# Patient Record
Sex: Female | Born: 1970 | Race: White | Hispanic: No | Marital: Married | Smoking: Never smoker
Health system: Southern US, Community
[De-identification: ages and names within clinical notes are randomized; demographics above are authoritative.]

## PROBLEM LIST (undated history)

## (undated) DIAGNOSIS — J189 Pneumonia, unspecified organism: Secondary | ICD-10-CM

## (undated) DIAGNOSIS — R519 Headache, unspecified: Secondary | ICD-10-CM

## (undated) DIAGNOSIS — Z803 Family history of malignant neoplasm of breast: Secondary | ICD-10-CM

## (undated) DIAGNOSIS — R51 Headache: Secondary | ICD-10-CM

## (undated) DIAGNOSIS — M1990 Unspecified osteoarthritis, unspecified site: Secondary | ICD-10-CM

## (undated) DIAGNOSIS — Z800 Family history of malignant neoplasm of digestive organs: Secondary | ICD-10-CM

## (undated) DIAGNOSIS — Z8489 Family history of other specified conditions: Secondary | ICD-10-CM

## (undated) DIAGNOSIS — K219 Gastro-esophageal reflux disease without esophagitis: Secondary | ICD-10-CM

## (undated) HISTORY — DX: Family history of malignant neoplasm of breast: Z80.3

## (undated) HISTORY — DX: Family history of malignant neoplasm of digestive organs: Z80.0

## (undated) HISTORY — PX: BREAST BIOPSY: SHX20

---

## 2006-12-01 ENCOUNTER — Encounter: Admission: RE | Admit: 2006-12-01 | Discharge: 2006-12-01 | Payer: Self-pay | Admitting: Surgery

## 2006-12-16 ENCOUNTER — Ambulatory Visit: Payer: Self-pay | Admitting: Oncology

## 2007-02-21 ENCOUNTER — Ambulatory Visit: Payer: Self-pay | Admitting: Oncology

## 2017-11-07 ENCOUNTER — Other Ambulatory Visit (HOSPITAL_COMMUNITY): Payer: Self-pay | Admitting: Gynecology

## 2017-11-07 DIAGNOSIS — Z803 Family history of malignant neoplasm of breast: Secondary | ICD-10-CM

## 2017-11-11 ENCOUNTER — Telehealth: Payer: Self-pay | Admitting: Genetic Counselor

## 2017-11-11 ENCOUNTER — Encounter: Payer: Self-pay | Admitting: Genetic Counselor

## 2017-11-11 NOTE — Patient Instructions (Addendum)
Aiysha Jillson  11/11/2017   Your procedure is scheduled on: 11-21-17     Report to [facility] Main  Entrance    Report to Admitting at 8:15 AM    Call this number if you have problems the morning of surgery [phone]     Remember: Do not eat food or drink liquids :After Midnight.    BRUSH YOUR TEETH MORNING OF SURGERY AND RINSE YOUR MOUTH OUT, NO CHEWING GUM CANDY OR MINTS.     Take these medicines the morning of surgery with A SIP OF WATER: Loratadine (Claritin) and Sudafed. You can also bring and use your eyedrops as needed.                                 You may not have any metal on your body including hair pins and              piercings  Do not wear jewelry, make-up, lotions, powders or perfumes, deodorant             Do not wear nail polish.  Do not shave  48 hours prior to surgery.                Do not bring valuables to the hospital. [place].  Contacts, dentures or bridgework may not be worn into surgery.  Leave suitcase in the car. After surgery it may be brought to your room.      Special Instructions: N/A              Please read over the following fact sheets you were given: _____________________________________________________________________             [facility] - Preparing for Surgery Before surgery, you can play an important role.  Because skin is not sterile, your skin needs to be as free of germs as possible.  You can reduce the number of germs on your skin by washing with CHG (chlorahexidine gluconate) soap before surgery.  CHG is an antiseptic cleaner which kills germs and bonds with the skin to continue killing germs even after washing. Please DO NOT use if you have an allergy to CHG or antibacterial soaps.  If your skin becomes reddened/irritated stop using the CHG and inform your nurse when you arrive at Short Stay. Do not shave (including legs and underarms) for  at least 48 hours prior to the first CHG shower.  You may shave your face/neck. Please follow these instructions carefully:  1.  Shower with CHG Soap the night before surgery and the  morning of Surgery.  2.  If you choose to wash your hair, wash your hair first as usual with your  normal  shampoo.  3.  After you shampoo, rinse your hair and body thoroughly to remove the  shampoo.                           4.  Use CHG as you would any other liquid soap.  You can apply chg directly  to the skin and wash                       Gently  with a scrungie or clean washcloth.  5.  Apply the CHG Soap to your body ONLY FROM THE NECK DOWN.   Do not use on face/ open                           Wound or open sores. Avoid contact with eyes, ears mouth and genitals (private parts).                       Wash face,  Genitals (private parts) with your normal soap.             6.  Wash thoroughly, paying special attention to the area where your surgery  will be performed.  7.  Thoroughly rinse your body with warm water from the neck down.  8.  DO NOT shower/wash with your normal soap after using and rinsing off  the CHG Soap.                9.  Pat yourself dry with a clean towel.            10.  Wear clean pajamas.            11.  Place clean sheets on your bed the night of your first shower and do not  sleep with pets. Day of Surgery : Do not apply any lotions/deodorants the morning of surgery.  Please wear clean clothes to the hospital/surgery center.  FAILURE TO FOLLOW THESE INSTRUCTIONS MAY RESULT IN THE CANCELLATION OF YOUR SURGERY PATIENT SIGNATURE_________________________________  NURSE SIGNATURE__________________________________  ________________________________________________________________________

## 2017-11-11 NOTE — Telephone Encounter (Signed)
Received a genetic counseling appt from the [facility] for fhx of breast cancer. Pt has been scheduled to see Roma Kayser on 12/23 at 9am. Pt aware to arrive 15 minutes early. Letter mailed.

## 2017-11-14 ENCOUNTER — Ambulatory Visit (HOSPITAL_COMMUNITY)
Admission: RE | Admit: 2017-11-14 | Discharge: 2017-11-14 | Disposition: A | Discharge status: Home / Self Care | Payer: Non-veteran care | Source: Ambulatory Visit | Attending: Gynecology | Admitting: Gynecology

## 2017-11-14 ENCOUNTER — Encounter (HOSPITAL_COMMUNITY): Payer: Self-pay | Admitting: *Deleted

## 2017-11-14 ENCOUNTER — Encounter (HOSPITAL_COMMUNITY)
Admission: RE | Admit: 2017-11-14 | Discharge: 2017-11-14 | Disposition: A | Discharge status: Home / Self Care | Payer: Non-veteran care | Source: Ambulatory Visit | Attending: Orthopedic Surgery | Admitting: Orthopedic Surgery

## 2017-11-14 ENCOUNTER — Other Ambulatory Visit: Payer: Self-pay

## 2017-11-14 DIAGNOSIS — Z01818 Encounter for other preprocedural examination: Secondary | ICD-10-CM | POA: Insufficient documentation

## 2017-11-14 DIAGNOSIS — Z803 Family history of malignant neoplasm of breast: Secondary | ICD-10-CM | POA: Diagnosis not present

## 2017-11-14 DIAGNOSIS — Z807 Family history of other malignant neoplasms of lymphoid, hematopoietic and related tissues: Secondary | ICD-10-CM | POA: Insufficient documentation

## 2017-11-14 DIAGNOSIS — N6002 Solitary cyst of left breast: Secondary | ICD-10-CM | POA: Insufficient documentation

## 2017-11-14 DIAGNOSIS — N6001 Solitary cyst of right breast: Secondary | ICD-10-CM | POA: Diagnosis not present

## 2017-11-14 HISTORY — DX: Unspecified osteoarthritis, unspecified site: M19.90

## 2017-11-14 HISTORY — DX: Headache, unspecified: R51.9

## 2017-11-14 HISTORY — DX: Gastro-esophageal reflux disease without esophagitis: K21.9

## 2017-11-14 HISTORY — DX: Pneumonia, unspecified organism: J18.9

## 2017-11-14 HISTORY — DX: Headache: R51

## 2017-11-14 LAB — BASIC METABOLIC PANEL
Anion gap: 9 (ref 5–15)
BUN: 14 mg/dL (ref 6–20)
CALCIUM: 9.7 mg/dL (ref 8.9–10.3)
CO2: 27 mmol/L (ref 22–32)
CREATININE: 0.79 mg/dL (ref 0.44–1.00)
Chloride: 102 mmol/L (ref 98–111)
GFR calc Af Amer: >60 mL/min (ref >60)
GFR calc non Af Amer: >60 mL/min (ref >60)
GLUCOSE: 91 mg/dL (ref 70–99)
Potassium: 4.4 mmol/L (ref 3.5–5.1)
Sodium: 138 mmol/L (ref 135–145)

## 2017-11-14 LAB — SURGICAL PCR SCREEN
MRSA, PCR: NEGATIVE
Staphylococcus aureus: POSITIVE — AB

## 2017-11-14 LAB — CBC
HCT: 39.9 % (ref 36.0–46.0)
Hemoglobin: 12.5 g/dL (ref 12.0–15.0)
MCH: 28.4 pg (ref 26.0–34.0)
MCHC: 31.3 g/dL (ref 30.0–36.0)
MCV: 90.7 fL (ref 80.0–100.0)
PLATELETS: 296 10*3/uL (ref 150–400)
RBC: 4.4 MIL/uL (ref 3.87–5.11)
RDW: 14 % (ref 11.5–15.5)
WBC: 5.4 10*3/uL (ref 4.0–10.5)
nRBC: 0 % (ref 0.0–0.2)

## 2017-11-14 LAB — PREGNANCY, URINE: Preg Test, Ur: NEGATIVE

## 2017-11-14 MED ORDER — GADOBUTROL 1 MMOL/ML IV SOLN
8.0000 mL | Freq: Once | INTRAVENOUS | Status: AC | PRN
  Administered 2017-11-14: 8 mL via INTRAVENOUS

## 2017-11-14 NOTE — Progress Notes (Addendum)
11-14-17 PCR result routed to Dr. Lyla Glassing for review   11-06-17 Surgical clearance from Dr. Joetta Manners on chart.

## 2017-11-15 ENCOUNTER — Ambulatory Visit: Payer: Self-pay | Admitting: Orthopedic Surgery

## 2017-11-15 NOTE — H&P (Signed)
PARTIAL KNEE ADMISSION H&P  Patient is being admitted for right patellofemoral arthroplasty.  Subjective:  Chief Complaint:right knee pain.  HPI: Holly Young, 47 y.o. female, has a history of pain and functional disability in the right knee due to arthritis and has failed non-surgical conservative treatments for greater than 12 weeks to includeNSAID's and/or analgesics, corticosteriod injections, flexibility and strengthening excercises, use of assistive devices, weight reduction as appropriate and activity modification.  Onset of symptoms was gradual, starting >10 years ago with rapidlly worsening course since that time. The patient noted no past surgery on the right knee(s).  Patient currently rates pain in the right knee(s) at 10 out of 10 with activity. Patient has night pain, worsening of pain with activity and weight bearing, pain that interferes with activities of daily living, pain with passive range of motion and crepitus.  Patient has evidence of subchondral cysts, subchondral sclerosis, periarticular osteophytes, joint subluxation and joint space narrowing by imaging studies.  There is no active infection.  There are no active problems to display for this patient.  Past Medical History:  Diagnosis Date  . Arthritis   . GERD (gastroesophageal reflux disease)   . Headache    Migraines  . Pneumonia     Past Surgical History:  Procedure Laterality Date  . BREAST BIOPSY Left    22 years ago    No current facility-administered medications for this visit.    No current outpatient medications on file.   Facility-Administered Medications Ordered in Other Visits  Medication Dose Route Frequency Provider Last Rate Last Dose  . 0.9 %  sodium chloride infusion   Intravenous Continuous Kelcey Korus, Aaron Edelman, MD      . acetaminophen (OFIRMEV) IV 1,000 mg  1,000 mg Intravenous To OR Deanna Boehlke, Aaron Edelman, MD      . ceFAZolin (ANCEF) IVPB 2g/100 mL premix  2 g Intravenous On Call to [place], Aaron Edelman, MD      . chlorhexidine (HIBICLENS) 4 % liquid 4 application  60 mL Topical Once Koury Roddy, Aaron Edelman, MD      . fentaNYL (SUBLIMAZE) injection 50-100 mcg  50-100 mcg Intravenous UD Duane Boston, MD      . lactated ringers infusion   Intravenous Continuous Duane Boston, MD 50 mL/hr at 11/21/17 0908    . midazolam (VERSED) injection 1-2 mg  1-2 mg Intravenous Sherri Rad, MD      . tranexamic acid (CYKLOKAPRON) IVPB 1,000 mg  1,000 mg Intravenous On Call to [place], MD       Allergies  Allergen Reactions  . Adhesive [Tape]     Blisters  . Betadine [Povidone Iodine] Other (See Comments)    Eats her skin away    Social History   Tobacco Use  . Smoking status: Never Smoker  . Smokeless tobacco: Never Used  Substance Use Topics  . Alcohol use: Yes    Alcohol/week: 1.0 standard drinks    Types: 1 Cans of beer per week    Comment: daily    No family history on file.   Review of Systems  Constitutional: Negative.   HENT: Positive for tinnitus.   Eyes: Negative.   Respiratory: Negative.   Cardiovascular: Negative.   Gastrointestinal: Positive for constipation, diarrhea, heartburn, nausea and vomiting.  Genitourinary: Positive for hematuria.  Musculoskeletal: Positive for back pain, joint pain and neck pain.  Skin: Negative.   Neurological: Positive for dizziness and headaches.  Endo/Heme/Allergies: Positive for environmental allergies.  Psychiatric/Behavioral: Negative.  Objective:  Physical Exam  Vitals reviewed. Constitutional: She is oriented to person, place, and time. She appears well-developed and well-nourished.  HENT:  Head: Normocephalic and atraumatic.  Eyes: Pupils are equal, round, and reactive to light. Conjunctivae and EOM are normal.  Neck: Normal range of motion. Neck supple.  Cardiovascular: Normal rate, regular rhythm and intact distal pulses.  Respiratory: Effort normal. No respiratory distress.  GI: Soft. She exhibits no  distension.  Genitourinary:  Genitourinary Comments: deferred  Neurological: She is alert and oriented to person, place, and time. She has normal reflexes.  Skin: Skin is warm and dry.  Psychiatric: She has a normal mood and affect. Her behavior is normal. Judgment and thought content normal.    Vital signs in last 24 hours: @VSRANGES @  Labs:   Estimated body mass index is 25.7 kg/m as calculated from the following:   Height as of 11/14/17: 5\' 10"  (1.778 m).   Weight as of 11/14/17: 81.3 kg.   Imaging Review Plain radiographs demonstrate severe  Patellofemoral degenerative joint disease of the right knee(s). The overall alignment isneutral. The bone quality appears to be adequate for age and reported activity level.   Preoperative templating of the joint replacement has been completed, documented, and submitted to the Operating Room personnel in order to optimize intra-operative equipment management.    Patient's anticipated LOS is less than 2 midnights, meeting these requirements: - Younger than 51 - Lives within 1 hour of care - Has a competent adult at home to recover with post-op recover - NO history of  - Chronic pain requiring opiods  - Diabetes  - Coronary Artery Disease  - Heart failure  - Heart attack  - Stroke  - DVT/VTE  - Cardiac arrhythmia  - Respiratory Failure/COPD  - Renal failure  - Anemia  - Advanced Liver disease        Assessment/Plan:  End stage patellofemoral arthritis, right knee   The patient history, physical examination, clinical judgment of the provider and imaging studies are consistent with end stage degenerative joint disease of the right knee(s) and patellofemoral arthroplasty is deemed medically necessary. The treatment options including medical management, injection therapy arthroscopy and arthroplasty were discussed at length. The risks and benefits of total knee arthroplasty were presented and reviewed. The risks due to  aseptic loosening, infection, stiffness, patella tracking problems, thromboembolic complications and other imponderables were discussed. The patient acknowledged the explanation, agreed to proceed with the plan and consent was signed. Patient is being admitted for inpatient treatment for surgery, pain control, PT, OT, prophylactic antibiotics, VTE prophylaxis, progressive ambulation and ADL's and discharge planning. The patient is planning to be discharged home with outpatient PT

## 2017-11-15 NOTE — H&P (View-Only) (Signed)
PARTIAL KNEE ADMISSION H&P  Patient is being admitted for right patellofemoral arthroplasty.  Subjective:  Chief Complaint:right knee pain.  HPI: Holly Young, 47 y.o. female, has a history of pain and functional disability in the right knee due to arthritis and has failed non-surgical conservative treatments for greater than 12 weeks to includeNSAID's and/or analgesics, corticosteriod injections, flexibility and strengthening excercises, use of assistive devices, weight reduction as appropriate and activity modification.  Onset of symptoms was gradual, starting >10 years ago with rapidlly worsening course since that time. The patient noted no past surgery on the right knee(s).  Patient currently rates pain in the right knee(s) at 10 out of 10 with activity. Patient has night pain, worsening of pain with activity and weight bearing, pain that interferes with activities of daily living, pain with passive range of motion and crepitus.  Patient has evidence of subchondral cysts, subchondral sclerosis, periarticular osteophytes, joint subluxation and joint space narrowing by imaging studies.  There is no active infection.  There are no active problems to display for this patient.  Past Medical History:  Diagnosis Date  . Arthritis   . GERD (gastroesophageal reflux disease)   . Headache    Migraines  . Pneumonia     Past Surgical History:  Procedure Laterality Date  . BREAST BIOPSY Left    22 years ago    No current facility-administered medications for this visit.    No current outpatient medications on file.   Facility-Administered Medications Ordered in Other Visits  Medication Dose Route Frequency Provider Last Rate Last Dose  . 0.9 %  sodium chloride infusion   Intravenous Continuous Kylah Maresh, Aaron Edelman, MD      . acetaminophen (OFIRMEV) IV 1,000 mg  1,000 mg Intravenous To OR Janet Humphreys, Aaron Edelman, MD      . ceFAZolin (ANCEF) IVPB 2g/100 mL premix  2 g Intravenous On Call to [place], Aaron Edelman, MD      . chlorhexidine (HIBICLENS) 4 % liquid 4 application  60 mL Topical Once [place] Manternach, Aaron Edelman, MD      . fentaNYL (SUBLIMAZE) injection 50-100 mcg  50-100 mcg Intravenous UD Duane Boston, MD      . lactated ringers infusion   Intravenous Continuous Duane Boston, MD 50 mL/hr at 11/21/17 0908    . midazolam (VERSED) injection 1-2 mg  1-2 mg Intravenous Sherri Rad, MD      . tranexamic acid (CYKLOKAPRON) IVPB 1,000 mg  1,000 mg Intravenous On Call to [place], MD       Allergies  Allergen Reactions  . Adhesive [Tape]     Blisters  . Betadine [Povidone Iodine] Other (See Comments)    Eats her skin away    Social History   Tobacco Use  . Smoking status: Never Smoker  . Smokeless tobacco: Never Used  Substance Use Topics  . Alcohol use: Yes    Alcohol/week: 1.0 standard drinks    Types: 1 Cans of beer per week    Comment: daily    No family history on file.   Review of Systems  Constitutional: Negative.   HENT: Positive for tinnitus.   Eyes: Negative.   Respiratory: Negative.   Cardiovascular: Negative.   Gastrointestinal: Positive for constipation, diarrhea, heartburn, nausea and vomiting.  Genitourinary: Positive for hematuria.  Musculoskeletal: Positive for back pain, joint pain and neck pain.  Skin: Negative.   Neurological: Positive for dizziness and headaches.  Endo/Heme/Allergies: Positive for environmental allergies.  Psychiatric/Behavioral: Negative.  Objective:  Physical Exam  Vitals reviewed. Constitutional: She is oriented to person, place, and time. She appears well-developed and well-nourished.  HENT:  Head: Normocephalic and atraumatic.  Eyes: Pupils are equal, round, and reactive to light. Conjunctivae and EOM are normal.  Neck: Normal range of motion. Neck supple.  Cardiovascular: Normal rate, regular rhythm and intact distal pulses.  Respiratory: Effort normal. No respiratory distress.  GI: Soft. She exhibits no  distension.  Genitourinary:  Genitourinary Comments: deferred  Neurological: She is alert and oriented to person, place, and time. She has normal reflexes.  Skin: Skin is warm and dry.  Psychiatric: She has a normal mood and affect. Her behavior is normal. Judgment and thought content normal.    Vital signs in last 24 hours: @VSRANGES @  Labs:   Estimated body mass index is 25.7 kg/m as calculated from the following:   Height as of 11/14/17: 5\' 10"  (1.778 m).   Weight as of 11/14/17: 81.3 kg.   Imaging Review Plain radiographs demonstrate severe  Patellofemoral degenerative joint disease of the right knee(s). The overall alignment isneutral. The bone quality appears to be adequate for age and reported activity level.   Preoperative templating of the joint replacement has been completed, documented, and submitted to the Operating Room personnel in order to optimize intra-operative equipment management.    Patient's anticipated LOS is less than 2 midnights, meeting these requirements: - Younger than 63 - Lives within 1 hour of care - Has a competent adult at home to recover with post-op recover - NO history of  - Chronic pain requiring opiods  - Diabetes  - Coronary Artery Disease  - Heart failure  - Heart attack  - Stroke  - DVT/VTE  - Cardiac arrhythmia  - Respiratory Failure/COPD  - Renal failure  - Anemia  - Advanced Liver disease        Assessment/Plan:  End stage patellofemoral arthritis, right knee   The patient history, physical examination, clinical judgment of the provider and imaging studies are consistent with end stage degenerative joint disease of the right knee(s) and patellofemoral arthroplasty is deemed medically necessary. The treatment options including medical management, injection therapy arthroscopy and arthroplasty were discussed at length. The risks and benefits of total knee arthroplasty were presented and reviewed. The risks due to  aseptic loosening, infection, stiffness, patella tracking problems, thromboembolic complications and other imponderables were discussed. The patient acknowledged the explanation, agreed to proceed with the plan and consent was signed. Patient is being admitted for inpatient treatment for surgery, pain control, PT, OT, prophylactic antibiotics, VTE prophylaxis, progressive ambulation and ADL's and discharge planning. The patient is planning to be discharged home with outpatient PT

## 2017-11-20 ENCOUNTER — Inpatient Hospital Stay
Admission: RE | Admit: 2017-11-20 | Discharge: 2017-11-20 | Disposition: A | Discharge status: Home / Self Care | Payer: Self-pay | Source: Ambulatory Visit | Attending: Surgery | Admitting: Surgery

## 2017-11-20 ENCOUNTER — Other Ambulatory Visit (HOSPITAL_COMMUNITY): Payer: Self-pay | Admitting: Surgery

## 2017-11-20 DIAGNOSIS — C801 Malignant (primary) neoplasm, unspecified: Secondary | ICD-10-CM

## 2017-11-20 MED ORDER — TRANEXAMIC ACID-NACL 1000-0.7 MG/100ML-% IV SOLN
1000.0000 mg | INTRAVENOUS | Status: AC
  Administered 2017-11-21: 1000 mg via INTRAVENOUS

## 2017-11-21 ENCOUNTER — Inpatient Hospital Stay (HOSPITAL_COMMUNITY): Payer: No Typology Code available for payment source

## 2017-11-21 ENCOUNTER — Inpatient Hospital Stay (HOSPITAL_COMMUNITY): Payer: No Typology Code available for payment source | Admitting: Anesthesiology

## 2017-11-21 ENCOUNTER — Inpatient Hospital Stay (HOSPITAL_COMMUNITY)
Admission: RE | Admit: 2017-11-21 | Discharge: 2017-11-22 | DRG: 470 | Disposition: A | Discharge status: Home Health | Payer: No Typology Code available for payment source | Attending: Orthopedic Surgery | Admitting: Orthopedic Surgery

## 2017-11-21 ENCOUNTER — Encounter (HOSPITAL_COMMUNITY): Payer: Self-pay | Admitting: *Deleted

## 2017-11-21 ENCOUNTER — Encounter (HOSPITAL_COMMUNITY)
Admission: RE | Disposition: A | Discharge status: Home Health | Payer: Self-pay | Source: Home / Self Care | Attending: Orthopedic Surgery

## 2017-11-21 ENCOUNTER — Other Ambulatory Visit: Payer: Self-pay

## 2017-11-21 DIAGNOSIS — Z888 Allergy status to other drugs, medicaments and biological substances status: Secondary | ICD-10-CM

## 2017-11-21 DIAGNOSIS — Z91048 Other nonmedicinal substance allergy status: Secondary | ICD-10-CM | POA: Diagnosis not present

## 2017-11-21 DIAGNOSIS — M25561 Pain in right knee: Secondary | ICD-10-CM | POA: Diagnosis present

## 2017-11-21 DIAGNOSIS — K219 Gastro-esophageal reflux disease without esophagitis: Secondary | ICD-10-CM | POA: Diagnosis present

## 2017-11-21 DIAGNOSIS — M1711 Unilateral primary osteoarthritis, right knee: Secondary | ICD-10-CM | POA: Diagnosis present

## 2017-11-21 DIAGNOSIS — Z419 Encounter for procedure for purposes other than remedying health state, unspecified: Secondary | ICD-10-CM

## 2017-11-21 HISTORY — PX: PATELLA-FEMORAL ARTHROPLASTY: SHX5037

## 2017-11-21 LAB — TYPE AND SCREEN
ABO/RH(D): O NEG
Antibody Screen: NEGATIVE

## 2017-11-21 LAB — ABO/RH: ABO/RH(D): O NEG

## 2017-11-21 SURGERY — ARTHROPLASTY, PATELLOFEMORAL
Anesthesia: Spinal | Site: Knee | Laterality: Right

## 2017-11-21 MED ORDER — LACTATED RINGERS IV SOLN
INTRAVENOUS | Status: DC
  Administered 2017-11-21 (×2): via INTRAVENOUS

## 2017-11-21 MED ORDER — BUPIVACAINE IN DEXTROSE 0.75-8.25 % IT SOLN
INTRATHECAL | Status: DC | PRN
  Administered 2017-11-21: 1.8 mg via INTRATHECAL

## 2017-11-21 MED ORDER — SODIUM CHLORIDE 0.9 % IR SOLN
Status: DC | PRN
  Administered 2017-11-21: 1000 mL
  Administered 2017-11-21: 3000 mL

## 2017-11-21 MED ORDER — CEFAZOLIN SODIUM-DEXTROSE 2-4 GM/100ML-% IV SOLN
2.0000 g | INTRAVENOUS | Status: AC
  Administered 2017-11-21: 2 g via INTRAVENOUS
  Filled 2017-11-21: qty 100

## 2017-11-21 MED ORDER — METHOCARBAMOL 500 MG PO TABS
500.0000 mg | ORAL_TABLET | Freq: Four times a day (QID) | ORAL | Status: DC | PRN
  Administered 2017-11-21 – 2017-11-22 (×2): 500 mg via ORAL
  Filled 2017-11-21 (×2): qty 1

## 2017-11-21 MED ORDER — HYDROMORPHONE HCL 1 MG/ML IJ SOLN
INTRAMUSCULAR | Status: AC
  Filled 2017-11-21: qty 1

## 2017-11-21 MED ORDER — VITAMIN D 25 MCG (1000 UNIT) PO TABS
2000.0000 [IU] | ORAL_TABLET | Freq: Every day | ORAL | Status: DC
  Administered 2017-11-21 – 2017-11-22 (×2): 2000 [IU] via ORAL
  Filled 2017-11-21 (×2): qty 2

## 2017-11-21 MED ORDER — METOCLOPRAMIDE HCL 5 MG/ML IJ SOLN: 5.0000 mg | Freq: Three times a day (TID) | INTRAMUSCULAR | Status: DC | PRN

## 2017-11-21 MED ORDER — SENNA 8.6 MG PO TABS
1.0000 | ORAL_TABLET | Freq: Two times a day (BID) | ORAL | Status: DC
  Administered 2017-11-21 – 2017-11-22 (×2): 8.6 mg via ORAL
  Filled 2017-11-21 (×2): qty 1

## 2017-11-21 MED ORDER — DIPHENHYDRAMINE HCL 12.5 MG/5ML PO ELIX: 12.5000 mg | ORAL_SOLUTION | ORAL | Status: DC | PRN

## 2017-11-21 MED ORDER — ROPIVACAINE HCL 7.5 MG/ML IJ SOLN
INTRAMUSCULAR | Status: DC | PRN
  Administered 2017-11-21: 20 mL via PERINEURAL

## 2017-11-21 MED ORDER — PROPOFOL 500 MG/50ML IV EMUL
INTRAVENOUS | Status: DC | PRN
  Administered 2017-11-21: 30 mg via INTRAVENOUS

## 2017-11-21 MED ORDER — ONDANSETRON HCL 4 MG/2ML IJ SOLN: 4.0000 mg | Freq: Four times a day (QID) | INTRAMUSCULAR | Status: DC | PRN

## 2017-11-21 MED ORDER — HYDROCODONE-ACETAMINOPHEN 5-325 MG PO TABS
1.0000 | ORAL_TABLET | ORAL | Status: DC | PRN
  Administered 2017-11-21 – 2017-11-22 (×4): 2 via ORAL
  Filled 2017-11-21 (×4): qty 2

## 2017-11-21 MED ORDER — CEFAZOLIN SODIUM-DEXTROSE 2-4 GM/100ML-% IV SOLN
2.0000 g | Freq: Four times a day (QID) | INTRAVENOUS | Status: AC
  Administered 2017-11-21 (×2): 2 g via INTRAVENOUS
  Filled 2017-11-21 (×2): qty 100

## 2017-11-21 MED ORDER — PROMETHAZINE HCL 25 MG/ML IJ SOLN: 6.2500 mg | INTRAMUSCULAR | Status: DC | PRN

## 2017-11-21 MED ORDER — METHOCARBAMOL 500 MG IVPB - SIMPLE MED
500.0000 mg | Freq: Four times a day (QID) | INTRAVENOUS | Status: DC | PRN
  Administered 2017-11-21: 500 mg via INTRAVENOUS
  Filled 2017-11-21: qty 50

## 2017-11-21 MED ORDER — POVIDONE-IODINE 10 % EX SWAB: 2.0000 "application " | Freq: Once | CUTANEOUS | Status: DC

## 2017-11-21 MED ORDER — SODIUM CHLORIDE 0.9 % IV SOLN
INTRAVENOUS | Status: DC
  Administered 2017-11-21 – 2017-11-22 (×3): via INTRAVENOUS

## 2017-11-21 MED ORDER — METOCLOPRAMIDE HCL 5 MG PO TABS: 5.0000 mg | ORAL_TABLET | Freq: Three times a day (TID) | ORAL | Status: DC | PRN

## 2017-11-21 MED ORDER — DEXAMETHASONE SODIUM PHOSPHATE 10 MG/ML IJ SOLN
10.0000 mg | Freq: Once | INTRAMUSCULAR | Status: AC
  Administered 2017-11-22: 10 mg via INTRAVENOUS
  Filled 2017-11-21: qty 1

## 2017-11-21 MED ORDER — TRANEXAMIC ACID-NACL 1000-0.7 MG/100ML-% IV SOLN
INTRAVENOUS | Status: AC
  Filled 2017-11-21: qty 100

## 2017-11-21 MED ORDER — MENTHOL 3 MG MT LOZG: 1.0000 | LOZENGE | OROMUCOSAL | Status: DC | PRN

## 2017-11-21 MED ORDER — LORATADINE 10 MG PO TABS
10.0000 mg | ORAL_TABLET | Freq: Every day | ORAL | Status: DC
  Administered 2017-11-22: 10 mg via ORAL
  Filled 2017-11-21: qty 1

## 2017-11-21 MED ORDER — CYANOCOBALAMIN 500 MCG PO TABS
1000.0000 ug | ORAL_TABLET | Freq: Every day | ORAL | Status: DC
  Administered 2017-11-21 – 2017-11-22 (×2): 1000 ug via ORAL
  Filled 2017-11-21 (×2): qty 2

## 2017-11-21 MED ORDER — DOCUSATE SODIUM 100 MG PO CAPS
100.0000 mg | ORAL_CAPSULE | Freq: Two times a day (BID) | ORAL | Status: DC
  Administered 2017-11-21 – 2017-11-22 (×2): 100 mg via ORAL
  Filled 2017-11-21 (×2): qty 1

## 2017-11-21 MED ORDER — MAGNESIUM OXIDE 400 (241.3 MG) MG PO TABS
800.0000 mg | ORAL_TABLET | Freq: Every day | ORAL | Status: DC
  Administered 2017-11-21 – 2017-11-22 (×2): 800 mg via ORAL
  Filled 2017-11-21 (×2): qty 2

## 2017-11-21 MED ORDER — ONDANSETRON HCL 4 MG PO TABS: 4.0000 mg | ORAL_TABLET | Freq: Four times a day (QID) | ORAL | Status: DC | PRN

## 2017-11-21 MED ORDER — METHOCARBAMOL 500 MG IVPB - SIMPLE MED
INTRAVENOUS | Status: AC
  Administered 2017-11-21: 500 mg via INTRAVENOUS
  Filled 2017-11-21: qty 50

## 2017-11-21 MED ORDER — PHENOL 1.4 % MT LIQD
1.0000 | OROMUCOSAL | Status: DC | PRN
  Filled 2017-11-21: qty 177

## 2017-11-21 MED ORDER — FENTANYL CITRATE (PF) 100 MCG/2ML IJ SOLN
50.0000 ug | INTRAMUSCULAR | Status: DC
  Administered 2017-11-21: 100 ug via INTRAVENOUS
  Filled 2017-11-21: qty 2

## 2017-11-21 MED ORDER — KETOROLAC TROMETHAMINE 15 MG/ML IJ SOLN
15.0000 mg | Freq: Four times a day (QID) | INTRAMUSCULAR | Status: AC
  Administered 2017-11-21 – 2017-11-22 (×4): 15 mg via INTRAVENOUS
  Filled 2017-11-21 (×4): qty 1

## 2017-11-21 MED ORDER — 0.9 % SODIUM CHLORIDE (POUR BTL) OPTIME
TOPICAL | Status: DC | PRN
  Administered 2017-11-21: 1000 mL

## 2017-11-21 MED ORDER — STERILE WATER FOR IRRIGATION IR SOLN
Status: DC | PRN
  Administered 2017-11-21: 2000 mL

## 2017-11-21 MED ORDER — SODIUM CHLORIDE 0.9 % IV SOLN: INTRAVENOUS | Status: DC

## 2017-11-21 MED ORDER — MIDAZOLAM HCL 5 MG/5ML IJ SOLN
INTRAMUSCULAR | Status: DC | PRN
  Administered 2017-11-21: 2 mg via INTRAVENOUS

## 2017-11-21 MED ORDER — MELATONIN 3 MG PO TABS
3.0000 mg | ORAL_TABLET | Freq: Every evening | ORAL | Status: DC | PRN
  Filled 2017-11-21: qty 1

## 2017-11-21 MED ORDER — ACETAMINOPHEN 325 MG PO TABS: 325.0000 mg | ORAL_TABLET | Freq: Four times a day (QID) | ORAL | Status: DC | PRN

## 2017-11-21 MED ORDER — ONDANSETRON HCL 4 MG/2ML IJ SOLN
INTRAMUSCULAR | Status: DC | PRN
  Administered 2017-11-21: 4 mg via INTRAVENOUS

## 2017-11-21 MED ORDER — POLYETHYLENE GLYCOL 3350 17 G PO PACK: 17.0000 g | PACK | Freq: Every day | ORAL | Status: DC | PRN

## 2017-11-21 MED ORDER — ACETAMINOPHEN 10 MG/ML IV SOLN
1000.0000 mg | INTRAVENOUS | Status: AC
  Administered 2017-11-21: 1000 mg via INTRAVENOUS
  Filled 2017-11-21: qty 100

## 2017-11-21 MED ORDER — CHLORHEXIDINE GLUCONATE 4 % EX LIQD: 60.0000 mL | Freq: Once | CUTANEOUS | Status: DC

## 2017-11-21 MED ORDER — BUPIVACAINE HCL (PF) 0.25 % IJ SOLN
INTRAMUSCULAR | Status: DC | PRN
  Administered 2017-11-21: 30 mL

## 2017-11-21 MED ORDER — HYDROCODONE-ACETAMINOPHEN 7.5-325 MG PO TABS: 1.0000 | ORAL_TABLET | ORAL | Status: DC | PRN

## 2017-11-21 MED ORDER — FENTANYL CITRATE (PF) 100 MCG/2ML IJ SOLN
INTRAMUSCULAR | Status: DC | PRN
  Administered 2017-11-21: 100 ug via INTRAVENOUS

## 2017-11-21 MED ORDER — DEXAMETHASONE SODIUM PHOSPHATE 10 MG/ML IJ SOLN
INTRAMUSCULAR | Status: DC | PRN
  Administered 2017-11-21: 10 mg via INTRAVENOUS

## 2017-11-21 MED ORDER — ASPIRIN 81 MG PO CHEW
81.0000 mg | CHEWABLE_TABLET | Freq: Two times a day (BID) | ORAL | Status: DC
  Administered 2017-11-21 – 2017-11-22 (×2): 81 mg via ORAL
  Filled 2017-11-21 (×2): qty 1

## 2017-11-21 MED ORDER — POLYVINYL ALCOHOL 1.4 % OP SOLN
1.0000 [drp] | Freq: Every morning | OPHTHALMIC | Status: DC
  Filled 2017-11-21 (×2): qty 15

## 2017-11-21 MED ORDER — MORPHINE SULFATE (PF) 2 MG/ML IV SOLN: 0.5000 mg | INTRAVENOUS | Status: DC | PRN

## 2017-11-21 MED ORDER — SODIUM CHLORIDE (PF) 0.9 % IJ SOLN
INTRAMUSCULAR | Status: DC | PRN
  Administered 2017-11-21: 30 mL

## 2017-11-21 MED ORDER — MIDAZOLAM HCL 2 MG/2ML IJ SOLN
1.0000 mg | INTRAMUSCULAR | Status: DC
  Administered 2017-11-21: 2 mg via INTRAVENOUS
  Filled 2017-11-21: qty 2

## 2017-11-21 MED ORDER — ALUM & MAG HYDROXIDE-SIMETH 200-200-20 MG/5ML PO SUSP
30.0000 mL | ORAL | Status: DC | PRN
  Administered 2017-11-22: 30 mL via ORAL
  Filled 2017-11-21: qty 30

## 2017-11-21 MED ORDER — PROPOFOL 500 MG/50ML IV EMUL
INTRAVENOUS | Status: DC | PRN
  Administered 2017-11-21: 125 ug/kg/min via INTRAVENOUS

## 2017-11-21 MED ORDER — BENZONATATE 100 MG PO CAPS
100.0000 mg | ORAL_CAPSULE | Freq: Every day | ORAL | Status: DC
  Administered 2017-11-21: 100 mg via ORAL
  Filled 2017-11-21: qty 1

## 2017-11-21 MED ORDER — KETOROLAC TROMETHAMINE 30 MG/ML IJ SOLN
INTRAMUSCULAR | Status: DC | PRN
  Administered 2017-11-21: 30 mg

## 2017-11-21 MED ORDER — HYDROMORPHONE HCL 1 MG/ML IJ SOLN
0.2500 mg | INTRAMUSCULAR | Status: DC | PRN
  Administered 2017-11-21: 0.25 mg via INTRAVENOUS

## 2017-11-21 SURGICAL SUPPLY — 52 items
BAG DECANTER FOR FLEXI CONT (MISCELLANEOUS) IMPLANT
BAG ZIPLOCK 12X15 (MISCELLANEOUS) IMPLANT
BANDAGE ACE 6X5 VEL STRL LF (GAUZE/BANDAGES/DRESSINGS) ×3 IMPLANT
BANDAGE ELASTIC 4 VELCRO ST LF (GAUZE/BANDAGES/DRESSINGS) ×3 IMPLANT
BNDG ELASTIC 6X10 VLCR STRL LF (GAUZE/BANDAGES/DRESSINGS) ×3 IMPLANT
BOWL SMART MIX CTS (DISPOSABLE) ×3 IMPLANT
BUR SURG PFJ MILL NEXGEN (Knees) ×1 IMPLANT
BURR SURG PFJ MILL NEXGEN (Knees) ×3 IMPLANT
CEMENT BONE R 1X40 (Cement) ×6 IMPLANT
COMP FEM NEXGEN SZ3 +3.5 RT (Knees) ×3 IMPLANT
COMPONENT FEM NEXGN SZ3 +3.5RT (Knees) ×1 IMPLANT
COVER SURGICAL LIGHT HANDLE (MISCELLANEOUS) ×3 IMPLANT
COVER WAND RF STERILE (DRAPES) IMPLANT
CUFF TOURN SGL QUICK 34 (TOURNIQUET CUFF) ×2
CUFF TRNQT CYL 34X4X40X1 (TOURNIQUET CUFF) ×1 IMPLANT
DERMABOND ADVANCED (GAUZE/BANDAGES/DRESSINGS) ×4
DERMABOND ADVANCED .7 DNX12 (GAUZE/BANDAGES/DRESSINGS) ×2 IMPLANT
DRAPE U-SHAPE 47X51 STRL (DRAPES) ×3 IMPLANT
DRSG AQUACEL AG ADV 3.5X10 (GAUZE/BANDAGES/DRESSINGS) ×3 IMPLANT
ELECT REM PT RETURN 15FT ADLT (MISCELLANEOUS) ×3 IMPLANT
GLOVE BIOGEL M 7.0 STRL (GLOVE) IMPLANT
GLOVE BIOGEL PI IND STRL 7.0 (GLOVE) ×3 IMPLANT
GLOVE BIOGEL PI IND STRL 7.5 (GLOVE) ×5 IMPLANT
GLOVE BIOGEL PI IND STRL 8.5 (GLOVE) ×2 IMPLANT
GLOVE BIOGEL PI INDICATOR 7.0 (GLOVE) ×6
GLOVE BIOGEL PI INDICATOR 7.5 (GLOVE) ×10
GLOVE BIOGEL PI INDICATOR 8.5 (GLOVE) ×4
GOWN STRL REUS W/TWL 2XL LVL3 (GOWN DISPOSABLE) ×3 IMPLANT
GOWN STRL REUS W/TWL XL LVL3 (GOWN DISPOSABLE) ×3 IMPLANT
HANDPIECE INTERPULSE COAX TIP (DISPOSABLE) ×2
MANIFOLD NEPTUNE II (INSTRUMENTS) ×3 IMPLANT
PACK TOTAL KNEE CUSTOM (KITS) ×3 IMPLANT
SAW OSC TIP CART 19.5X105X1.3 (SAW) ×3 IMPLANT
SCREW HEADED 33MM KNEE (MISCELLANEOUS) ×9 IMPLANT
SET HNDPC FAN SPRY TIP SCT (DISPOSABLE) ×1 IMPLANT
SET PAD KNEE POSITIONER (MISCELLANEOUS) ×3 IMPLANT
STEM POLY PAT PLY 32M KNEE (Knees) ×3 IMPLANT
SUT MNCRL 3 0 RB1 (SUTURE) ×1 IMPLANT
SUT MNCRL AB 4-0 PS2 18 (SUTURE) ×3 IMPLANT
SUT MONOCRYL 3 0 RB1 (SUTURE) ×2
SUT STRATAFIX 0 PDS 27 VIOLET (SUTURE) ×3
SUT STRATAFIX PDO 1 14 VIOLET (SUTURE) ×2
SUT STRATFX PDO 1 14 VIOLET (SUTURE) ×1
SUT VIC AB 1 CT1 36 (SUTURE) ×3 IMPLANT
SUT VIC AB 2-0 CT1 27 (SUTURE) ×6
SUT VIC AB 2-0 CT1 TAPERPNT 27 (SUTURE) ×3 IMPLANT
SUTURE STRATFX 0 PDS 27 VIOLET (SUTURE) ×1 IMPLANT
SUTURE STRATFX PDO 1 14 VIOLET (SUTURE) ×1 IMPLANT
TRAY FOLEY BAG SILVER LF 16FR (CATHETERS) ×3 IMPLANT
WATER STERILE IRR 1000ML POUR (IV SOLUTION) ×3 IMPLANT
WRAP KNEE MAXI GEL POST OP (GAUZE/BANDAGES/DRESSINGS) ×3 IMPLANT
YANKAUER SUCT BULB TIP 10FT TU (MISCELLANEOUS) ×3 IMPLANT

## 2017-11-21 NOTE — Discharge Instructions (Signed)
° °Dr. Kaiana Marion °Total Joint Specialist °[facility] °[address] °[place], [place] [address] °[phone] ° °TOTAL KNEE REPLACEMENT POSTOPERATIVE DIRECTIONS ° ° ° °Knee Rehabilitation, Guidelines Following Surgery  °Results after knee surgery are often greatly improved when you follow the exercise, range of motion and muscle strengthening exercises prescribed by your doctor. Safety measures are also important to protect the knee from further injury. Any time any of these exercises cause you to have increased pain or swelling in your knee joint, decrease the amount until you are comfortable again and slowly increase them. If you have problems or questions, call your caregiver or physical therapist for advice.  ° °WEIGHT BEARING °Weight bearing as tolerated with assist device (walker, cane, etc) as directed, use it as long as suggested by your surgeon or therapist, typically at least 4-6 weeks. ° °HOME CARE INSTRUCTIONS  °Remove items at home which could result in a fall. This includes throw rugs or furniture in walking pathways.  °Continue medications as instructed at time of discharge. °You may have some home medications which will be placed on hold until you complete the course of blood thinner medication.  °You may start showering once you are discharged home but do not submerge the incision under water. Just pat the incision dry and apply a dry gauze dressing on daily. °Walk with walker as instructed.  °You may resume a sexual relationship in one month or when given the OK by your doctor.  °· Use walker as long as suggested by your caregivers. °· Avoid periods of inactivity such as sitting longer than an hour when not asleep. This helps prevent blood clots.  °You may put full weight on your legs and walk as much as is comfortable.  °You may return to work once you are cleared by your doctor.  °Do not drive a car for 6 weeks or until released by you surgeon.  °· Do not drive  while taking narcotics.  °Wear the elastic stockings for three weeks following surgery during the day but you may remove then at night. °Make sure you keep all of your appointments after your operation with all of your doctors and caregivers. You should call the office at the above phone number and make an appointment for approximately two weeks after the date of your surgery. °Do not remove your surgical dressing. The dressing is waterproof; you may take showers in 3 days, but do not take tub baths or submerge the dressing. °Please pick up a stool softener and laxative for home use as long as you are requiring pain medications. °· ICE to the affected knee every three hours for 30 minutes at a time and then as needed for pain and swelling.  Continue to use ice on the knee for pain and swelling from surgery. You may notice swelling that will progress down to the foot and ankle.  This is normal after surgery.  Elevate the leg when you are not up walking on it.   °It is important for you to complete the blood thinner medication as prescribed by your doctor. °· Continue to use the breathing machine which will help keep your temperature down.  It is common for your temperature to cycle up and down following surgery, especially at night when you are not up moving around and exerting yourself.  The breathing machine keeps your lungs expanded and your temperature down. ° °RANGE OF MOTION AND STRENGTHENING EXERCISES  °Rehabilitation of the knee is important following   a knee injury or an operation. After just a few days of immobilization, the muscles of the thigh which control the knee become weakened and shrink (atrophy). Knee exercises are designed to build up the tone and strength of the thigh muscles and to improve knee motion. Often times heat used for twenty to thirty minutes before working out will loosen up your tissues and help with improving the range of motion but do not use heat for the first two weeks following  surgery. These exercises can be done on a training (exercise) mat, on the floor, on a table or on a bed. Use what ever works the best and is most comfortable for you Knee exercises include:  °Leg Lifts - While your knee is still immobilized in a splint or cast, you can do straight leg raises. Lift the leg to 60 degrees, hold for 3 sec, and slowly lower the leg. Repeat 10-20 times 2-3 times daily. Perform this exercise against resistance later as your knee gets better.  °Quad and Hamstring Sets - Tighten up the muscle on the front of the thigh (Quad) and hold for 5-10 sec. Repeat this 10-20 times hourly. Hamstring sets are done by pushing the foot backward against an object and holding for 5-10 sec. Repeat as with quad sets.  °A rehabilitation program following serious knee injuries can speed recovery and prevent re-injury in the future due to weakened muscles. Contact your doctor or a physical therapist for more information on knee rehabilitation.  ° °SKILLED REHAB INSTRUCTIONS: °If the patient is transferred to a skilled rehab facility following release from the hospital, a list of the current medications will be sent to the facility for the patient to continue.  When discharged from the skilled rehab facility, please have the facility set up the patient's Home Health Physical Therapy prior to being released. Also, the skilled facility will be responsible for providing the patient with their medications at time of release from the facility to include their pain medication, the muscle relaxants, and their blood thinner medication. If the patient is still at the rehab facility at time of the two week follow up appointment, the skilled rehab facility will also need to assist the patient in arranging follow up appointment in our office and any transportation needs. ° °MAKE SURE YOU:  °Understand these instructions.  °Will watch your condition.  °Will get help right away if you are not doing well or get worse.   ° ° °Pick up stool softner and laxative for home use following surgery while on pain medications. °Do NOT remove your dressing. You may shower.  °Do not take tub baths or submerge incision under water. °May shower starting three days after surgery. °Please use a clean towel to pat the incision dry following showers. °Continue to use ice for pain and swelling after surgery. °Do not use any lotions or creams on the incision until instructed by your surgeon. ° °

## 2017-11-21 NOTE — Progress Notes (Signed)
Assisted Dr. Singer with right, ultrasound guided, adductor canal block. Side rails up, monitors on throughout procedure. See vital signs in flow sheet. Tolerated Procedure well.  

## 2017-11-21 NOTE — Interval H&P Note (Signed)
History and Physical Interval Note:  11/21/2017 9:43 AM  Holly Young  has presented today for surgery, with the diagnosis of Degenerative joint disease right knee  The various methods of treatment have been discussed with the patient and family. After consideration of risks, benefits and other options for treatment, the patient has consented to  Procedure(s): PATELLA-FEMORAL ARTHROPLASTY (Right) as a surgical intervention .  The patient's history has been reviewed, patient examined, no change in status, stable for surgery.  I have reviewed the patient's chart and labs.  Questions were answered to the patient's satisfaction.     Hilton Cork Norene Oliveri

## 2017-11-21 NOTE — Transfer of Care (Signed)
Immediate Anesthesia Transfer of Care Note  Patient: Holly Young  Procedure(s) Performed: PATELLA-FEMORAL ARTHROPLASTY (Right Knee)  Patient Location: PACU  Anesthesia Type:Spinal  Level of Consciousness: awake, alert  and oriented  Airway & Oxygen Therapy: Patient Spontanous Breathing and Patient connected to face mask oxygen  Post-op Assessment: Report given to RN and Post -op Vital signs reviewed and stable  Post vital signs: Reviewed and stable  Last Vitals:  Vitals Value Taken Time  BP    Temp    Pulse    Resp    SpO2      Last Pain:  Vitals:   11/21/17 0956  TempSrc:   PainSc: 0-No pain         Complications: No apparent anesthesia complications

## 2017-11-21 NOTE — Evaluation (Signed)
Physical Therapy Evaluation Patient Details Name: Jeniffer Culliver MRN: [id number] DOB: December 24, 1970 Today's Date: 11/21/2017   History of Present Illness  47 yo female s/p R knee patellofemoral arthroplasty on 11/21/17. PMH includes OA, GERD, migraines.   Clinical Impression  Pt presents with R knee pain, decreased R knee ROM, increased time and effort to perform mobility tasks, increased difficulty rising and sitting in chair, and decreased tolerance for ambulation due to knee pain. Pt to benefit from acute PT to address deficits. Pt ambulated 50 ft with RW with min guard assist. Pt educated on ankle pumps (20/hour) to perform this evening to increase circulation, to pt's tolerance and limited by pain. PT to progress mobility as tolerated, and will continue to follow acutely.      Follow Up Recommendations Follow surgeon's recommendation for DC plan and follow-up therapies;Supervision for mobility/OOB(HHPT per pt )    Equipment Recommendations  Rolling walker with 5" wheels    Recommendations for Other Services       Precautions / Restrictions Precautions Precautions: Fall Restrictions Weight Bearing Restrictions: No Other Position/Activity Restrictions: WBAT      Mobility  Bed Mobility Overal bed mobility: Needs Assistance Bed Mobility: Supine to Sit;Sit to Supine     Supine to sit: Min assist;HOB elevated Sit to supine: Min assist;HOB elevated   General bed mobility comments: Min assist for supine<>sit for LE management, slow lowering to floor and raising off the floor to get out and in of bed, respectively.   Transfers Overall transfer level: Needs assistance Equipment used: Rolling walker (2 wheeled) Transfers: Sit to/from Stand Sit to Stand: Min assist;From elevated surface         General transfer comment: Min assist for power up and steadying upon standing. Pt with weight shifting L and R without difficulty, some increased R knee pain with WB. Sit to stand x2,  once from bed and once from recliner to practice in and out of recliner.   Ambulation/Gait Ambulation/Gait assistance: Min guard Gait Distance (Feet): 50 Feet Assistive device: Rolling walker (2 wheeled) Gait Pattern/deviations: Step-to pattern;Decreased stride length;Decreased stance time - right;Decreased weight shift to right;Antalgic Gait velocity: decr    General Gait Details: Min guard for safety. Verbal cuing for sequencing, placement in RW, turning towards non-operative side.   Stairs            Wheelchair Mobility    Modified Rankin (Stroke Patients Only)       Balance Overall balance assessment: Mild deficits observed, not formally tested                                           Pertinent Vitals/Pain Pain Assessment: 0-10 Pain Score: 3  Pain Location: R knee, with ambulation  Pain Descriptors / Indicators: Sore Pain Intervention(s): Limited activity within patient's tolerance;Repositioned;Ice applied;Monitored during session    Home Living Family/patient expects to be discharged to:: Private residence Living Arrangements: Children Available Help at Discharge: Family;Available PRN/intermittently;Neighbor Type of Home: House Home Access: Stairs to enter Entrance Stairs-Rails: None Entrance Stairs-Number of Steps: 2 Home Layout: Two level;Able to live on main level with bedroom/bathroom Home Equipment: None      Prior Function Level of Independence: Independent               Hand Dominance   Dominant Hand: Right    Extremity/Trunk Assessment   Upper Extremity Assessment Upper  Extremity Assessment: Overall WFL for tasks assessed    Lower Extremity Assessment Lower Extremity Assessment: Overall WFL for tasks assessed;RLE deficits/detail RLE Deficits / Details: suspected post-surgical weakness; able to perform SLR with assist, quad sets, ankle pumps, heel slide to tolerance  RLE Sensation: WNL    Cervical / Trunk  Assessment Cervical / Trunk Assessment: Kyphotic  Communication   Communication: No difficulties  Cognition Arousal/Alertness: Awake/alert Behavior During Therapy: WFL for tasks assessed/performed Overall Cognitive Status: Within Functional Limits for tasks assessed                                        General Comments      Exercises     Assessment/Plan    PT Assessment Patient needs continued PT services  PT Problem List Decreased strength;Pain;Decreased range of motion;Decreased activity tolerance;Decreased knowledge of use of DME;Decreased balance;Decreased safety awareness;Decreased mobility       PT Treatment Interventions Therapeutic activities;DME instruction;Gait training;Therapeutic exercise;Patient/family education;Stair training;Balance training;Functional mobility training    PT Goals (Current goals can be found in the Care Plan section)  Acute Rehab PT Goals PT Goal Formulation: With patient Time For Goal Achievement: 11/28/17 Potential to Achieve Goals: Good    Frequency 7X/week   Barriers to discharge        Co-evaluation               AM-PAC PT "6 Clicks" Daily Activity  Outcome Measure Difficulty turning over in bed (including adjusting bedclothes, sheets and blankets)?: Unable Difficulty moving from lying on back to sitting on the side of the bed? : Unable Difficulty sitting down on and standing up from a chair with arms (e.g., wheelchair, bedside commode, etc,.)?: Unable Help needed moving to and from a bed to chair (including a wheelchair)?: A Little Help needed walking in hospital room?: A Little Help needed climbing 3-5 steps with a railing? : A Little 6 Click Score: 12    End of Session Equipment Utilized During Treatment: Gait belt Activity Tolerance: Patient tolerated treatment well Patient left: in bed;with bed alarm set;with call bell/phone within reach;with family/visitor present;with SCD's reapplied Nurse  Communication: Mobility status PT Visit Diagnosis: Other abnormalities of gait and mobility (R26.89);Difficulty in walking, not elsewhere classified (R26.2)    Time: 7989-2119 PT Time Calculation (min) (ACUTE ONLY): 33 min   Charges:   PT Evaluation $PT Eval Low Complexity: 1 Low PT Treatments $Gait Training: 8-22 mins        Julien Girt, PT Acute Rehabilitation Services Pager [phone]  Office [phone]   Trianna Lupien D Ladarrious Kirksey 11/21/2017, 7:30 PM

## 2017-11-21 NOTE — Anesthesia Preprocedure Evaluation (Addendum)
Anesthesia Evaluation  Patient identified by MRN, date of birth, ID band Patient awake    Reviewed: Allergy & Precautions, NPO status , Patient's Chart, lab work & pertinent test results  Airway Mallampati: II  TM Distance: >3 FB Neck ROM: Full    Dental no notable dental hx. (+) Dental Advisory Given   Pulmonary neg pulmonary ROS,    Pulmonary exam normal        Cardiovascular negative cardio ROS Normal cardiovascular exam     Neuro/Psych negative neurological ROS  negative psych ROS   GI/Hepatic Neg liver ROS, GERD  ,  Endo/Other  negative endocrine ROS  Renal/GU negative Renal ROS  negative genitourinary   Musculoskeletal negative musculoskeletal ROS (+)   Abdominal   Peds negative pediatric ROS (+)  Hematology negative hematology ROS (+)   Anesthesia Other Findings   Reproductive/Obstetrics negative OB ROS                            Anesthesia Physical Anesthesia Plan  ASA: II  Anesthesia Plan: Spinal   Post-op Pain Management:  Regional for Post-op pain   Induction:   PONV Risk Score and Plan: 2 and Ondansetron and Propofol infusion  Airway Management Planned: Natural Airway and Simple Face Mask  Additional Equipment:   Intra-op Plan:   Post-operative Plan:   Informed Consent: I have reviewed the patients History and Physical, chart, labs and discussed the procedure including the risks, benefits and alternatives for the proposed anesthesia with the patient or authorized representative who has indicated his/her understanding and acceptance.   Dental advisory given  Plan Discussed with: CRNA, Anesthesiologist and Surgeon  Anesthesia Plan Comments:        Anesthesia Quick Evaluation

## 2017-11-21 NOTE — Anesthesia Procedure Notes (Signed)
Anesthesia Regional Block: Adductor canal block   Pre-Anesthetic Checklist: ,, timeout performed, Correct Patient, Correct Site, Correct Laterality, Correct Procedure, Correct Position, site marked, Risks and benefits discussed,  Surgical consent,  Pre-op evaluation,  At surgeon's request and post-op pain management  Laterality: Right  Prep: chloraprep       Needles:  Injection technique: Single-shot  Needle Type: Stimulator Needle - 80     Needle Length: 10cm  Needle Gauge: 21     Additional Needles:   Narrative:  Start time: 11/21/2017 9:42 AM End time: 11/21/2017 9:52 AM Injection made incrementally with aspirations every 5 mL.  Performed by: Personally

## 2017-11-21 NOTE — Anesthesia Procedure Notes (Signed)
Spinal  Patient location during procedure: OR Start time: 11/21/2017 10:53 AM End time: 11/21/2017 11:03 AM Staffing Anesthesiologist: Duane Boston, MD Performed: anesthesiologist  Preanesthetic Checklist Completed: patient identified, surgical consent, pre-op evaluation, timeout performed, IV checked, risks and benefits discussed and monitors and equipment checked Spinal Block Patient position: sitting Prep: DuraPrep Patient monitoring: cardiac monitor, continuous pulse ox and blood pressure Approach: midline Location: L2-3 Injection technique: single-shot Needle Needle type: Pencan  Needle gauge: 24 G Needle length: 9 cm Additional Notes Functioning IV was confirmed and monitors were applied. Sterile prep and drape, including hand hygiene and sterile gloves were used. The patient was positioned and the spine was prepped. The skin was anesthetized with lidocaine.  Free flow of clear CSF was obtained prior to injecting local anesthetic into the CSF.  The spinal needle aspirated freely following injection.  The needle was carefully withdrawn.  The patient tolerated the procedure well.

## 2017-11-21 NOTE — Op Note (Signed)
OPERATIVE REPORT  SURGEON: Rod Can, MD   ASSISTANT: Nehemiah Massed, PA-C.  PREOPERATIVE DIAGNOSIS: Right knee patellofemoral arthritis.   POSTOPERATIVE DIAGNOSIS: Right knee patellofemoral arthritis.   PROCEDURE: Right knee patellofemoral arthroplasty.   IMPLANTS: Zimmer Patellofemoral trochlea, size 3 Right. 3 button all poly patella, size 32 mm. Biomet bone cement.  ANESTHESIA:  MAC, Regional and Spinal  TOURNIQUET TIME: 56 min at 369m Hg.  ESTIMATED BLOOD LOSS:-100 mL    ANTIBIOTICS: 2 g Ancef.  DRAINS: None.  COMPLICATIONS: None   CONDITION: PACU - hemodynamically stable.   BRIEF CLINICAL NOTE: Holly Young a 47y.o. female with a long-standing history of Right knee patellofemoral arthritis. After failing conservative management, the patient was indicated for partial knee arthroplasty. The risks, benefits, and alternatives to the procedure were explained, and the patient elected to proceed.  PROCEDURE IN DETAIL: Regional anesthesia was obtained in the pre-op holding area. Once inside the operative room, spinal anesthesia was obtained, and a foley catheter was inserted. The patient was then positioned, a nonsterile tourniquet was placed, and the lower extremity was prepped and draped in the normal sterile surgical fashion. A time-out was called verifying side and site of surgery. The patient received IV antibiotics within 60 minutes of beginning the procedure. The extremity was exsanguinated with an Esmarch, and the tourniquet was inflated.  A limited anterior approach to the knee was performed utilizing a midvastus arthrotomy. Care was taken to preserve the menisci and articular cartilage.  Whiteside's line was marked with electrocautery. Drill was used to enter the medullary canal, and the contents were lavaged with saline and suctioned. The anterior cutting guide was positioned perpendicular to Whiteside's line and the depth of the resection was set.  The anterior cut was performed.   The trochlea was sized and the guide was pinned into place. The burr was used to mill the femur. The lug holes were drilled. The trial trochlea was inserted with excellent fit.  A freehand patellar resection was performed, and the patella was sized an prepared with 3 lug holes.  Trial patellar component was inserted. The patella had excellent tracking throughout the range of motion.   The cut bony surfaces were irrigated with pulse lavage. Final components were cemented into place and excess cement was cleared. The knee was brought into extension while the cement polymerized. Once the cement was hard, the knee was tested for a final time and found to be well balanced.  The wound was copiously irrigated with normal saline using pulse lavage. Marcaine solution was injected into the periarticular soft tissue. The wound was closed in layers using #1 Vicryl and Stratafix for the fascia, 2-0 Vicryl for the subcutaneous fat, 2-0 Monocryl for the deep dermal layer, 3-0 running Monocryl subcuticular Stitch, and Dermabond for the skin. Once the glue was fully dried, an Aquacell Ag and compressive dressing were applied. The tourniquet was let down, and the patient was transported to the recovery room in stable ondition. Sponge, needle, and instrument counts were correct at the end of the case x2. The patient tolerated the procedure well and there were no known complications.  Please note that a surgical assistant was a medical necessity for this procedure in order to perform it in a safe and expeditious manner. Surgical assistant was necessary to retract the ligaments and vital neurovascular structures to prevent injury to them and also necessary for proper positioning of the limb to allow for anatomic placement of the prosthesis.

## 2017-11-21 NOTE — Anesthesia Postprocedure Evaluation (Signed)
Anesthesia Post Note  Patient: Holly Young, Holly Young  Procedure(s) Performed: PATELLA-FEMORAL ARTHROPLASTY (Right Knee)     Patient location during evaluation: PACU Anesthesia Type: Spinal and MAC Level of consciousness: awake and alert Pain management: pain level controlled Vital Signs Assessment: post-procedure vital signs reviewed and stable Respiratory status: spontaneous breathing and respiratory function stable Cardiovascular status: blood pressure returned to baseline and stable Postop Assessment: spinal receding Anesthetic complications: no    Last Vitals:  Vitals:   11/21/17 1738 11/21/17 1910  BP: 118/70 120/66  Pulse: 73 74  Resp: 16 16  Temp: 36.8 C 36.8 C  SpO2: 100% 100%    Last Pain:  Vitals:   11/21/17 1946  TempSrc:   PainSc: [place]

## 2017-11-22 ENCOUNTER — Encounter (HOSPITAL_COMMUNITY): Payer: Self-pay | Admitting: Orthopedic Surgery

## 2017-11-22 LAB — BASIC METABOLIC PANEL
Anion gap: 7 (ref 5–15)
BUN: 10 mg/dL (ref 6–20)
CO2: 21 mmol/L — ABNORMAL LOW (ref 22–32)
CREATININE: 0.58 mg/dL (ref 0.44–1.00)
Calcium: 8.7 mg/dL — ABNORMAL LOW (ref 8.9–10.3)
Chloride: 112 mmol/L — ABNORMAL HIGH (ref 98–111)
Glucose, Bld: 113 mg/dL — ABNORMAL HIGH (ref 70–99)
POTASSIUM: 4.1 mmol/L (ref 3.5–5.1)
SODIUM: 140 mmol/L (ref 135–145)

## 2017-11-22 LAB — CBC
HCT: 33.2 % — ABNORMAL LOW (ref 36.0–46.0)
HEMOGLOBIN: 10.2 g/dL — AB (ref 12.0–15.0)
MCH: 27.7 pg (ref 26.0–34.0)
MCHC: 30.7 g/dL (ref 30.0–36.0)
MCV: 90.2 fL (ref 80.0–100.0)
PLATELETS: 220 10*3/uL (ref 150–400)
RBC: 3.68 MIL/uL — AB (ref 3.87–5.11)
RDW: 14.3 % (ref 11.5–15.5)
WBC: 11.1 10*3/uL — AB (ref 4.0–10.5)
nRBC: 0 % (ref 0.0–0.2)

## 2017-11-22 MED ORDER — ASPIRIN 81 MG PO CHEW: 81.0000 mg | CHEWABLE_TABLET | Freq: Two times a day (BID) | ORAL | 1 refills | Status: AC

## 2017-11-22 MED ORDER — HYDROCODONE-ACETAMINOPHEN 5-325 MG PO TABS: 1.0000 | ORAL_TABLET | Freq: Four times a day (QID) | ORAL | 0 refills | Status: AC | PRN

## 2017-11-22 MED ORDER — SENNA 8.6 MG PO TABS: 1.0000 | ORAL_TABLET | Freq: Two times a day (BID) | ORAL | 0 refills | Status: AC

## 2017-11-22 MED ORDER — DOCUSATE SODIUM 100 MG PO CAPS: 100.0000 mg | ORAL_CAPSULE | Freq: Two times a day (BID) | ORAL | 1 refills | Status: AC

## 2017-11-22 MED ORDER — ONDANSETRON HCL 4 MG PO TABS: 4.0000 mg | ORAL_TABLET | Freq: Four times a day (QID) | ORAL | 0 refills | Status: AC | PRN

## 2017-11-22 NOTE — Discharge Summary (Signed)
Physician Discharge Summary  Patient ID: Holly Young MRN: [id number] DOB/AGE: [birth year] 47 y.o.  Admit date: 11/21/2017 Discharge date: 11/22/2017  Admission Diagnoses:  Patellofemoral arthritis of right knee  Discharge Diagnoses:  Principal Problem:   Patellofemoral arthritis of right knee   Past Medical History:  Diagnosis Date  . Arthritis   . GERD (gastroesophageal reflux disease)   . Headache    Migraines  . Pneumonia     Surgeries: Procedure(s): PATELLA-FEMORAL ARTHROPLASTY on 11/21/2017   Consultants (if any):   Discharged Condition: Improved  Hospital Course: Kimbly Eanes is an 47 y.o. female who was admitted 11/21/2017 with a diagnosis of Patellofemoral arthritis of right knee and went to the operating room on 11/21/2017 and underwent the above named procedures.    She was given perioperative antibiotics:  Anti-infectives (From admission, onward)   Start     Dose/Rate Route Frequency Ordered Stop   11/21/17 1700  ceFAZolin (ANCEF) IVPB 2g/100 mL premix     2 g 200 mL/hr over 30 Minutes Intravenous Every 6 hours 11/21/17 1546 11/21/17 2357   11/21/17 0845  ceFAZolin (ANCEF) IVPB 2g/100 mL premix     2 g 200 mL/hr over 30 Minutes Intravenous On call to O.R. 11/21/17 7062 11/21/17 1056    .  She was given sequential compression devices, early ambulation, and ASA for DVT prophylaxis.  She benefited maximally from the hospital stay and there were no complications.    Recent vital signs:  Vitals:   11/22/17 0924 11/22/17 1338  BP: 122/67 (!) 142/82  Pulse: 67 83  Resp: 16 14  Temp: 98.4 F (36.9 C) 98.7 F (37.1 C)  SpO2: 100% 98%    Recent laboratory studies:  Lab Results  Component Value Date   HGB 10.2 (L) 11/22/2017   HGB 12.5 11/14/2017   Lab Results  Component Value Date   WBC 11.1 (H) 11/22/2017   PLT 220 11/22/2017   No results found for: INR Lab Results  Component Value Date   NA 140 11/22/2017   K 4.1 11/22/2017    CL 112 (H) 11/22/2017   CO2 21 (L) 11/22/2017   BUN 10 11/22/2017   CREATININE 0.58 11/22/2017   GLUCOSE 113 (H) 11/22/2017    Discharge Medications:   Allergies as of 11/22/2017      Reactions   Adhesive [tape]    Blisters   Betadine [povidone Iodine] Other (See Comments)   Eats her skin away   Latex Rash   Latex bandages cause irritation.       Medication List    TAKE these medications   aspirin 81 MG chewable tablet Chew 1 tablet (81 mg total) by mouth 2 (two) times daily.   benzonatate 100 MG capsule Commonly known as:  TESSALON Take 100 mg by mouth at bedtime.   diclofenac sodium 1 % Gel Commonly known as:  VOLTAREN Apply 2 g topically daily as needed (arthritis).   diphenhydrAMINE 25 MG tablet Commonly known as:  BENADRYL Take 25-50 mg by mouth at bedtime as needed (cough).   docusate sodium 100 MG capsule Commonly known as:  COLACE Take 1 capsule (100 mg total) by mouth 2 (two) times daily.   HYDROcodone-acetaminophen 5-325 MG tablet Commonly known as:  NORCO/VICODIN Take 1-2 tablets by mouth every 6 (six) hours as needed for moderate pain.   loratadine 10 MG tablet Commonly known as:  CLARITIN Take 10 mg by mouth daily.   Magnesium Oxide 420 MG Tabs Take 840 mg by  mouth daily.   Melatonin 3 MG Tabs Take 3 mg by mouth at bedtime as needed (sleep).   ondansetron 4 MG tablet Commonly known as:  ZOFRAN Take 1 tablet (4 mg total) by mouth every 6 (six) hours as needed for nausea.   OVER THE COUNTER MEDICATION Place 3-4 tablets under the tongue daily as needed (restless legs). Hyland's Restful Legs   polyvinyl alcohol 1.4 % ophthalmic solution Commonly known as:  LIQUIFILM TEARS Place 1 drop into both eyes every morning.   pseudoephedrine 120 MG 12 hr tablet Commonly known as:  SUDAFED Take 40-120 mg by mouth 2 (two) times daily as needed for congestion.   senna 8.6 MG Tabs tablet Commonly known as:  SENOKOT Take 1 tablet (8.6 mg total) by  mouth 2 (two) times daily.   vitamin B-12 500 MCG tablet Commonly known as:  CYANOCOBALAMIN Take 1,000 mcg by mouth daily.   Vitamin D3 50 MCG (2000 UT) Tabs Take 2,000 Units by mouth daily.            Durable Medical Equipment  (From admission, onward)         Start     Ordered   11/22/17 1105  For home use only DME Cane  Once     11/22/17 1105   11/21/17 1546  DME Walker rolling  Once    Question:  Patient needs a walker to treat with the following condition  Answer:  Patellofemoral arthritis of right knee   11/21/17 1546          Diagnostic Studies: Dg Knee 1-2 Views Right  Result Date: 11/21/2017 CLINICAL DATA:  Needle count.  Evaluate for foreign body. EXAM: RIGHT KNEE - 1-2 VIEW COMPARISON:  No prior. FINDINGS: Postsurgical changes noted of the right knee. Femoral prosthesis noted. Hardware intact. Degenerative changes and loose bodies noted about the right knee. No acute bony abnormality. Air noted the soft tissues. This is consistent prior surgery. Surgical sponge and metallic density noted over the lower thigh. Tiny calcified density noted of the proximal tib-fib region. No definite needle noted. IMPRESSION: No definite retained needle noted. Electronically Signed   By: Marcello Moores  Register   On: 11/21/2017 13:11   Mr Breast Bilateral W Wo Contrast Inc Cad  Result Date: 11/21/2017 CLINICAL DATA:  47 year old female for screening breast MRI. High lifetime risk for developing breast cancer and strong family history of breast cancer. LABS:  None performed today EXAM: BILATERAL BREAST MRI WITH AND WITHOUT CONTRAST TECHNIQUE: Multiplanar, multisequence MR images of both breasts were obtained prior to and following the intravenous administration of 8 ml of Gadavist Three-dimensional MR images were rendered by post-processing of the original MR data on an independent workstation. The three-dimensional MR images were interpreted, and findings are reported in the following complete  MRI report for this study. Three dimensional images were evaluated at the independent DynaCad workstation COMPARISON:  Prior outside mammograms and ultrasounds dated 08/19/2017 and prior studies FINDINGS: Breast composition: c. Heterogeneous fibroglandular tissue. Background parenchymal enhancement: Mild Right breast: No suspicious mass or abnormal enhancement. Scattered cysts are present. Left breast: No suspicious mass or abnormal enhancement. Scattered cysts are present. A 2.5 cm cyst within the INNER central LEFT breast exhibits mild rim enhancement compatible with a complicated/inflamed cyst. Lymph nodes: No abnormal appearing lymph nodes. Ancillary findings:  None. IMPRESSION: No MR evidence of breast malignancy. Bilateral breast cysts. A 2.5 cm cyst within the INNER central LEFT breast is complicated/inflamed. RECOMMENDATION: Bilateral screening mammogram in  1 year Consider annual screening breast MRI as indicated. BI-RADS CATEGORY  2: Benign. Electronically Signed   By: Margarette Canada M.D.   On: 11/21/2017 13:28    Disposition: Discharge disposition: 01-Home or Self Care       Discharge Instructions    Call MD / Call 911   Complete by:  As directed    If you experience chest pain or shortness of breath, CALL 911 and be transported to the hospital emergency room.  If you develope a fever above 101 F, pus (white drainage) or increased drainage or redness at the wound, or calf pain, call your surgeon's office.   Constipation Prevention   Complete by:  As directed    Drink plenty of fluids.  Prune juice may be helpful.  You may use a stool softener, such as Colace (over the counter) 100 mg twice a day.  Use MiraLax (over the counter) for constipation as needed.   Diet - low sodium heart healthy   Complete by:  As directed    Do not put a pillow under the knee. Place it under the heel.   Complete by:  As directed    Driving restrictions   Complete by:  As directed    No driving for 6 weeks    Increase activity slowly as tolerated   Complete by:  As directed    Lifting restrictions   Complete by:  As directed    No lifting for 6 weeks   TED hose   Complete by:  As directed    Use stockings (TED hose) for 2 weeks on both leg(s).  You may remove them at night for sleeping.      Follow-up Information    Skeeter Sheard, Aaron Edelman, MD. Schedule an appointment as soon as possible for a visit in 2 weeks.   Specialty:  Orthopedic Surgery Why:  For wound re-check Contact information: [address] [place] [address] [phone]            Signed: Hilton Cork Beckam Abdulaziz 11/22/2017, 1:43 PM

## 2017-11-22 NOTE — Care Management Note (Signed)
Case Management Note  Patient Details  Name: Holly Young MRN: [id number] Date of Birth: 02-Sep-1970  Subjective/Objective:   Discharge planning, spoke with patient at bedside. Have chosen [facility] for [facility] PT, evaluate and treat.     Action/Plan: Contacted [facility] for referral. Needs RW, faxed information to [place], patient to pick up. [phone]             Expected Discharge Date:  11/22/17               Expected Discharge Plan:  [place]  In-House Referral:  NA  Discharge planning Services  CM Consult  Post Acute Care Choice:  Home Health, Durable Medical Equipment Choice offered to:  Patient  DME Arranged:  Walker rolling DME Agency:  [facility], [place]:  PT [place]:  [facility] (formerly [facility])  Status of Service:  Completed, signed off  If discussed at [employer] of Stay Meetings, dates discussed:    Additional Comments:  Guadalupe Maple, RN 11/22/2017, 2:07 PM

## 2017-11-22 NOTE — Progress Notes (Signed)
Physical Therapy Treatment Patient Details Name: Holly Young MRN: [id number] DOB: 1970-08-23 Today's Date: 11/22/2017    History of Present Illness 47 yo female s/p R knee patellofemoral arthroplasty on 11/21/17. PMH includes OA, GERD, migraines.     PT Comments    POD # 1 am session Assisted with amb a greater distance, Practiced stairs then Performed some TE's following HEP handout.  Instructed on proper tech, freq as well as use of ICE.     Follow Up Recommendations  Follow surgeon's recommendation for DC plan and follow-up therapies;Supervision for mobility/OOB     Equipment Recommendations  Rolling walker with 5" wheels    Recommendations for Other Services       Precautions / Restrictions Precautions Precautions: Fall Restrictions Weight Bearing Restrictions: No Other Position/Activity Restrictions: WBAT    Mobility  Bed Mobility               General bed mobility comments: Pt OOB in recliner   Transfers Overall transfer level: Needs assistance Equipment used: Rolling walker (2 wheeled) Transfers: Sit to/from Stand Sit to Stand: Supervision;Min guard         General transfer comment: one VC safety with turns and leg extension prior to sit  Ambulation/Gait Ambulation/Gait assistance: Min guard Gait Distance (Feet): 85 Feet Assistive device: Rolling walker (2 wheeled) Gait Pattern/deviations: Step-to pattern;Decreased stride length;Decreased stance time - right;Decreased weight shift to right Gait velocity: decreased    General Gait Details: Min guard for safety. Verbal cuing for sequencing, placement in RW   Stairs Stairs: Yes Stairs assistance: Min assist Stair Management: No rails;Step to pattern;Forwards;With walker Number of Stairs: 3 General stair comments: 25% VC's on proper walker placement and safety    Wheelchair Mobility    Modified Rankin (Stroke Patients Only)       Balance                                             Cognition Arousal/Alertness: Awake/alert Behavior During Therapy: WFL for tasks assessed/performed Overall Cognitive Status: Within Functional Limits for tasks assessed                                        Exercises   Knee TE's 10 reps B LE ankle pumps 10 reps towel squeezes 10 reps knee presses 10 reps heel slides  10 reps SAQ's 10 reps SLR's 10 reps ABD Followed by ICE    General Comments        Pertinent Vitals/Pain Pain Assessment: 0-10 Pain Score: 6  Pain Location: R knee, with ambulation  Pain Descriptors / Indicators: Sore;Operative site guarding Pain Intervention(s): Monitored during session;Repositioned;Ice applied;Premedicated before session    Home Living                      Prior Function            PT Goals (current goals can now be found in the care plan section) Progress towards PT goals: Progressing toward goals    Frequency    7X/week      PT Plan Current plan remains appropriate    Co-evaluation              AM-PAC PT "6 Clicks" Daily Activity  Outcome Measure  Difficulty  turning over in bed (including adjusting bedclothes, sheets and blankets)?: A Lot Difficulty moving from lying on back to sitting on the side of the bed? : A Lot Difficulty sitting down on and standing up from a chair with arms (e.g., wheelchair, bedside commode, etc,.)?: A Lot Help needed moving to and from a bed to chair (including a wheelchair)?: A Lot Help needed walking in hospital room?: A Lot Help needed climbing 3-5 steps with a railing? : A Lot 6 Click Score: 12    End of Session Equipment Utilized During Treatment: Gait belt Activity Tolerance: Patient tolerated treatment well Patient left: in chair;with call bell/phone within reach Nurse Communication: Mobility status PT Visit Diagnosis: Other abnormalities of gait and mobility (R26.89);Difficulty in walking, not elsewhere classified (R26.2)      Time: 4098-1191 PT Time Calculation (min) (ACUTE ONLY): 40 min  Charges:  $Gait Training: 8-22 mins $Therapeutic Exercise: 8-22 mins $Therapeutic Activity: 8-22 mins                     {Loreley Schwall  PTA Acute  Rehabilitation Services Pager      [phone] Office      [phone]

## 2017-11-22 NOTE — Progress Notes (Signed)
    Subjective:  Patient reports pain as mild to moderate.  Denies N/V/CP/SOB. No c/o.  Objective:   VITALS:   Vitals:   11/22/17 0452 11/22/17 0751 11/22/17 0924 11/22/17 1338  BP: 118/70  122/67 (!) 142/82  Pulse: (!) 59  67 83  Resp: 17  16 14   Temp: 98.6 F (37 C)  98.4 F (36.9 C) 98.7 F (37.1 C)  TempSrc: Oral  Oral Oral  SpO2: 100%  100% 98%  Weight:  81.3 kg    Height:  5\' 10"  (1.778 m)      NAD ABD soft Sensation intact distally Intact pulses distally Dorsiflexion/Plantar flexion intact Incision: dressing C/D/I Compartment soft Able to SLR Flexes to 90 degrees  Lab Results  Component Value Date   WBC 11.1 (H) 11/22/2017   HGB 10.2 (L) 11/22/2017   HCT 33.2 (L) 11/22/2017   MCV 90.2 11/22/2017   PLT 220 11/22/2017   BMET    Component Value Date/Time   NA 140 11/22/2017 0446   K 4.1 11/22/2017 0446   CL 112 (H) 11/22/2017 0446   CO2 21 (L) 11/22/2017 0446   GLUCOSE 113 (H) 11/22/2017 0446   BUN 10 11/22/2017 0446   CREATININE 0.58 11/22/2017 0446   CALCIUM 8.7 (L) 11/22/2017 0446   GFRNONAA >60 11/22/2017 0446   GFRAA >60 11/22/2017 0446     Assessment/Plan: 1 Day Post-Op   Principal Problem:   Patellofemoral arthritis of right knee   WBAT with walker DVT ppx: Aspirin, SCDs, TEDS PO pain control PT/OT Dispo: D/C home today with HHPT   Hilton Cork Jenesa Foresta 11/22/2017, 1:40 PM   Rod Can, MD Cell: [phone] [place] is now [facility]  [place] [address] [place], [place], [place] [address] Phone: [phone] [email or web address] Facebook  [employer]

## 2017-11-22 NOTE — Addendum Note (Signed)
Addendum  created 11/22/17 0920 by Lollie Sails, CRNA   Charge Capture section accepted

## 2017-11-22 NOTE — Progress Notes (Signed)
Physical Therapy Treatment Patient Details Name: Holly Young MRN: [id number] DOB: Sep 12, 1970 Today's Date: 11/22/2017    History of Present Illness 47 yo female s/p R knee patellofemoral arthroplasty on 11/21/17. PMH includes OA, GERD, migraines.     PT Comments    POD # 1 pm session With daughter and friend present for education/instruction during PT session on all listed below.  Assisted with amb, assisted with stairs and educated on TE's as well as use of ICE/cryo cooler.   Pt has met goals to D/C to home.   Follow Up Recommendations  Follow surgeon's recommendation for DC plan and follow-up therapies;Supervision for mobility/OOB     Equipment Recommendations  Rolling walker with 5" wheels    Recommendations for Other Services       Precautions / Restrictions Precautions Precautions: Fall Restrictions Weight Bearing Restrictions: No Other Position/Activity Restrictions: WBAT    Mobility  Bed Mobility               General bed mobility comments: Pt OOB in recliner   Transfers Overall transfer level: Needs assistance Equipment used: Rolling walker (2 wheeled) Transfers: Sit to/from Stand Sit to Stand: Supervision;Min guard         General transfer comment: one VC safety with turns and leg extension prior to sit  Ambulation/Gait Ambulation/Gait assistance: Min guard Gait Distance (Feet): 115 Feet Assistive device: Rolling walker (2 wheeled) Gait Pattern/deviations: Step-to pattern;Decreased stride length;Decreased stance time - right;Decreased weight shift to right Gait velocity: decreased    General Gait Details: Min guard for safety. Verbal cuing for sequencing, placement in RW   Stairs Stairs: Yes Stairs assistance: Min assist Stair Management: No rails;Step to pattern;Forwards;With walker Number of Stairs: 3 General stair comments: 25% VC's on proper walker placement and safety    Wheelchair Mobility    Modified Rankin (Stroke  Patients Only)       Balance                                            Cognition Arousal/Alertness: Awake/alert Behavior During Therapy: WFL for tasks assessed/performed Overall Cognitive Status: Within Functional Limits for tasks assessed                                        Exercises  all sitting TE's  10 reps knee bends  10 reps assisted knee bends  10 reps LAQ's    General Comments        Pertinent Vitals/Pain Pain Assessment: 0-10 Pain Score: 6  Pain Location: R knee, with ambulation  Pain Descriptors / Indicators: Sore;Operative site guarding Pain Intervention(s): Monitored during session;Repositioned;Ice applied;Premedicated before session    Home Living                      Prior Function            PT Goals (current goals can now be found in the care plan section) Progress towards PT goals: Progressing toward goals    Frequency    7X/week      PT Plan Current plan remains appropriate    Co-evaluation              AM-PAC PT "6 Clicks" Daily Activity  Outcome Measure  Difficulty turning over in  bed (including adjusting bedclothes, sheets and blankets)?: A Lot Difficulty moving from lying on back to sitting on the side of the bed? : A Lot Difficulty sitting down on and standing up from a chair with arms (e.g., wheelchair, bedside commode, etc,.)?: A Lot Help needed moving to and from a bed to chair (including a wheelchair)?: A Lot Help needed walking in hospital room?: A Lot Help needed climbing 3-5 steps with a railing? : A Lot 6 Click Score: 12    End of Session Equipment Utilized During Treatment: Gait belt Activity Tolerance: Patient tolerated treatment well Patient left: in chair;with call bell/phone within reach Nurse Communication: Mobility status PT Visit Diagnosis: Other abnormalities of gait and mobility (R26.89);Difficulty in walking, not elsewhere classified (R26.2)     Time:  5686-1683 PT Time Calculation (min) (ACUTE ONLY): 40 min  Charges:  $Gait Training: 8-22 mins $Therapeutic Exercise: 8-22 mins $Therapeutic Activity: 8-22 mins                     Rica Koyanagi  PTA Acute  Rehabilitation Services Pager      [phone] Office      [phone]

## 2017-11-22 NOTE — Plan of Care (Signed)
Pt is stable. Plane of care previewed with pt. Pain of management in progress, effective.

## 2017-12-11 NOTE — Progress Notes (Signed)
Please place orders in Epic as patient is being scheduled for a pre-op appointment! Thank you! 

## 2017-12-13 ENCOUNTER — Ambulatory Visit: Payer: Self-pay | Admitting: Orthopedic Surgery

## 2017-12-23 ENCOUNTER — Encounter: Payer: Self-pay | Admitting: Genetic Counselor

## 2017-12-23 ENCOUNTER — Inpatient Hospital Stay: Payer: Non-veteran care

## 2017-12-23 ENCOUNTER — Inpatient Hospital Stay: Payer: Non-veteran care | Attending: Genetic Counselor | Admitting: Genetic Counselor

## 2017-12-23 ENCOUNTER — Inpatient Hospital Stay: Payer: Non-veteran care | Admitting: Genetic Counselor

## 2017-12-23 DIAGNOSIS — Z1379 Encounter for other screening for genetic and chromosomal anomalies: Secondary | ICD-10-CM | POA: Diagnosis not present

## 2017-12-23 DIAGNOSIS — Z800 Family history of malignant neoplasm of digestive organs: Secondary | ICD-10-CM | POA: Diagnosis not present

## 2017-12-23 DIAGNOSIS — Z803 Family history of malignant neoplasm of breast: Secondary | ICD-10-CM | POA: Diagnosis not present

## 2017-12-23 NOTE — Patient Instructions (Addendum)
Holly Young  12/23/2017   Your procedure is scheduled on: Thursday 01/02/2018  Report to [facility] Main  Entrance              Report to admitting at 0530  AM    Call this number if you have problems the morning of surgery [phone]    Remember: Do not eat food or drink liquids :After Midnight.               BRUSH YOUR TEETH MORNING OF SURGERY AND RINSE YOUR MOUTH OUT, NO CHEWING GUM CANDY OR MINTS.     Take these medicines the morning of surgery with A SIP OF WATER: Loratadine (Claritin), use eye drops                                You may not have any metal on your body including hair pins and              piercings  Do not wear jewelry, make-up, lotions, powders or perfumes, deodorant             Do not wear nail polish.  Do not shave  48 hours prior to surgery.               Do not bring valuables to the hospital. [place].  Contacts, dentures or bridgework may not be worn into surgery.  Leave suitcase in the car. After surgery it may be brought to your room.                  Please read over the following fact sheets you were given: _____________________________________________________________________             [facility] - Preparing for Surgery Before surgery, you can play an important role.  Because skin is not sterile, your skin needs to be as free of germs as possible.  You can reduce the number of germs on your skin by washing with CHG (chlorahexidine gluconate) soap before surgery.  CHG is an antiseptic cleaner which kills germs and bonds with the skin to continue killing germs even after washing. Please DO NOT use if you have an allergy to CHG or antibacterial soaps.  If your skin becomes reddened/irritated stop using the CHG and inform your nurse when you arrive at Short Stay. Do not shave (including legs and underarms) for at least 48 hours prior to the first CHG shower.   You may shave your face/neck. Please follow these instructions carefully:  1.  Shower with CHG Soap the night before surgery and the  morning of Surgery.  2.  If you choose to wash your hair, wash your hair first as usual with your  normal  shampoo.  3.  After you shampoo, rinse your hair and body thoroughly to remove the  shampoo.                           4.  Use CHG as you would any other liquid soap.  You can apply chg directly  to the skin and wash  Gently with a scrungie or clean washcloth.  5.  Apply the CHG Soap to your body ONLY FROM THE NECK DOWN.   Do not use on face/ open                           Wound or open sores. Avoid contact with eyes, ears mouth and genitals (private parts).                       Wash face,  Genitals (private parts) with your normal soap.             6.  Wash thoroughly, paying special attention to the area where your surgery  will be performed.  7.  Thoroughly rinse your body with warm water from the neck down.  8.  DO NOT shower/wash with your normal soap after using and rinsing off  the CHG Soap.                9.  Pat yourself dry with a clean towel.            10.  Wear clean pajamas.            11.  Place clean sheets on your bed the night of your first shower and do not  sleep with pets. Day of Surgery : Do not apply any lotions/deodorants the morning of surgery.  Please wear clean clothes to the hospital/surgery center.  FAILURE TO FOLLOW THESE INSTRUCTIONS MAY RESULT IN THE CANCELLATION OF YOUR SURGERY PATIENT SIGNATURE_________________________________  NURSE SIGNATURE__________________________________  ________________________________________________________________________   Adam Phenix  An incentive spirometer is a tool that can help keep your lungs clear and active. This tool measures how well you are filling your lungs with each breath. Taking long deep breaths may help reverse or decrease the chance of  developing breathing (pulmonary) problems (especially infection) following:  A long period of time when you are unable to move or be active. BEFORE THE PROCEDURE   If the spirometer includes an indicator to show your best effort, your nurse or respiratory therapist will set it to a desired goal.  If possible, sit up straight or lean slightly forward. Try not to slouch.  Hold the incentive spirometer in an upright position. INSTRUCTIONS FOR USE  1. Sit on the edge of your bed if possible, or sit up as far as you can in bed or on a chair. 2. Hold the incentive spirometer in an upright position. 3. Breathe out normally. 4. Place the mouthpiece in your mouth and seal your lips tightly around it. 5. Breathe in slowly and as deeply as possible, raising the piston or the ball toward the top of the column. 6. Hold your breath for 3-5 seconds or for as long as possible. Allow the piston or ball to fall to the bottom of the column. 7. Remove the mouthpiece from your mouth and breathe out normally. 8. Rest for a few seconds and repeat Steps 1 through 7 at least 10 times every 1-2 hours when you are awake. Take your time and take a few normal breaths between deep breaths. 9. The spirometer may include an indicator to show your best effort. Use the indicator as a goal to work toward during each repetition. 10. After each set of 10 deep breaths, practice coughing to be sure your lungs are clear. If you have an incision (the cut made at the time of surgery),  support your incision when coughing by placing a pillow or rolled up towels firmly against it. Once you are able to get out of bed, walk around indoors and cough well. You may stop using the incentive spirometer when instructed by your caregiver.  RISKS AND COMPLICATIONS  Take your time so you do not get dizzy or light-headed.  If you are in pain, you may need to take or ask for pain medication before doing incentive spirometry. It is harder to take a  deep breath if you are having pain. AFTER USE  Rest and breathe slowly and easily.  It can be helpful to keep track of a log of your progress. Your caregiver can provide you with a simple table to help with this. If you are using the spirometer at home, follow these instructions: [place] IF:   You are having difficultly using the spirometer.  You have trouble using the spirometer as often as instructed.  Your pain medication is not giving enough relief while using the spirometer.  You develop fever of 100.5 F (38.1 C) or higher. SEEK IMMEDIATE MEDICAL CARE IF:   You cough up bloody sputum that had not been present before.  You develop fever of 102 F (38.9 C) or greater.  You develop worsening pain at or near the incision site. MAKE SURE YOU:   Understand these instructions.  Will watch your condition.  Will get help right away if you are not doing well or get worse. Document Released: 04/30/2006 Document Revised: 03/12/2011 Document Reviewed: 07/01/2006 ExitCare Patient Information 2014 ExitCare, [place].   ________________________________________________________________________  WHAT IS A BLOOD TRANSFUSION? Blood Transfusion Information  A transfusion is the replacement of blood or some of its parts. Blood is made up of multiple cells which provide different functions.  Red blood cells carry oxygen and are used for blood loss replacement.  White blood cells fight against infection.  Platelets control bleeding.  Plasma helps clot blood.  Other blood products are available for specialized needs, such as hemophilia or other clotting disorders. BEFORE THE TRANSFUSION  Who gives blood for transfusions?   Healthy volunteers who are fully evaluated to make sure their blood is safe. This is blood bank blood. Transfusion therapy is the safest it has ever been in the practice of medicine. Before blood is taken from a donor, a complete history is taken to make  sure that person has no history of diseases nor engages in risky social behavior (examples are intravenous drug use or sexual activity with multiple partners). The donor's travel history is screened to minimize risk of transmitting infections, such as malaria. The donated blood is tested for signs of infectious diseases, such as HIV and hepatitis. The blood is then tested to be sure it is compatible with you in order to minimize the chance of a transfusion reaction. If you or a relative donates blood, this is often done in anticipation of surgery and is not appropriate for emergency situations. It takes many days to process the donated blood. RISKS AND COMPLICATIONS Although transfusion therapy is very safe and saves many lives, the main dangers of transfusion include:   Getting an infectious disease.  Developing a transfusion reaction. This is an allergic reaction to something in the blood you were given. Every precaution is taken to prevent this. The decision to have a blood transfusion has been considered carefully by your caregiver before blood is given. Blood is not given unless the benefits outweigh the risks. AFTER THE TRANSFUSION  Right after receiving a blood transfusion, you will usually feel much better and more energetic. This is especially true if your red blood cells have gotten low (anemic). The transfusion raises the level of the red blood cells which carry oxygen, and this usually causes an energy increase.  The nurse administering the transfusion will monitor you carefully for complications. HOME CARE INSTRUCTIONS  No special instructions are needed after a transfusion. You may find your energy is better. Speak with your caregiver about any limitations on activity for underlying diseases you may have. SEEK MEDICAL CARE IF:   Your condition is not improving after your transfusion.  You develop redness or irritation at the intravenous (IV) site. SEEK IMMEDIATE MEDICAL CARE IF:   Any of the following symptoms occur over the next 12 hours:  Shaking chills.  You have a temperature by mouth above 102 F (38.9 C), not controlled by medicine.  Chest, back, or muscle pain.  People around you feel you are not acting correctly or are confused.  Shortness of breath or difficulty breathing.  Dizziness and fainting.  You get a rash or develop hives.  You have a decrease in urine output.  Your urine turns a dark color or changes to pink, red, or brown. Any of the following symptoms occur over the next 10 days:  You have a temperature by mouth above 102 F (38.9 C), not controlled by medicine.  Shortness of breath.  Weakness after normal activity.  The white part of the eye turns yellow (jaundice).  You have a decrease in the amount of urine or are urinating less often.  Your urine turns a dark color or changes to pink, red, or brown. Document Released: 12/16/1999 Document Revised: 03/12/2011 Document Reviewed: 08/04/2007 [facility] Patient Information 2014 [place], [place].  _______________________________________________________________________

## 2017-12-23 NOTE — Progress Notes (Signed)
REFERRING PROVIDER: Wynonia Lawman, PA-C [place], [place] [address]  PRIMARY PROVIDER:  [place] VISIT:  1. Family history of breast cancer   2. Family history of colon cancer      HISTORY OF PRESENT ILLNESS:   Holly Young, a 47 y.o. female, was seen for a [facility] cancer genetics consultation at the request of Dr. Jimmye Norman due to a personal history of cancer.  Holly Young presents to clinic today to discuss the possibility of a hereditary predisposition to cancer, genetic testing, and to further clarify her future cancer risks, as well as potential cancer risks for family members.   Holly Young is a 47 y.o. female with no personal history of cancer.  She has had BRCA testing prior to 2013 that was negative and is here for updated genetic testing.  CANCER HISTORY:   No history exists.     HORMONAL RISK FACTORS:  Menarche was at age 50.  First live birth at age 48.  OCP use for approximately 3-4 years.  Ovaries intact: yes.  Hysterectomy: no.  Menopausal status: premenopausal.  HRT use: 0 years. Colonoscopy: yes; possible polyps'. Mammogram within the last year: yes. Number of breast biopsies: 2. Up to date with pelvic exams:  yes. Any excessive radiation exposure in the past:  no  Past Medical History:  Diagnosis Date  . Arthritis   . Family history of breast cancer   . Family history of colon cancer   . GERD (gastroesophageal reflux disease)   . Headache    Migraines  . Pneumonia     Past Surgical History:  Procedure Laterality Date  . BREAST BIOPSY Left    22 years ago  . PATELLA-FEMORAL ARTHROPLASTY Right 11/21/2017   Procedure: PATELLA-FEMORAL ARTHROPLASTY;  Surgeon: Rod Can, MD;  Location: [facility];  Service: Orthopedics;  Laterality: Right;    Social History   Socioeconomic History  . Marital status: Married    Spouse name: Not on file  . Number of children: Not on file  . Years of  education: Not on file  . Highest education level: Not on file  Occupational History  . Not on file  Social Needs  . Financial resource strain: Not on file  . Food insecurity:    Worry: Not on file    Inability: Not on file  . Transportation needs:    Medical: Not on file    Non-medical: Not on file  Tobacco Use  . Smoking status: Never Smoker  . Smokeless tobacco: Never Used  Substance and Sexual Activity  . Alcohol use: Yes    Alcohol/week: 1.0 standard drinks    Types: 1 Cans of beer per week    Comment: daily  . Drug use: Never  . Sexual activity: Not on file  Lifestyle  . Physical activity:    Days per week: Not on file    Minutes per session: Not on file  . Stress: Not on file  Relationships  . Social connections:    Talks on phone: Not on file    Gets together: Not on file    Attends religious service: Not on file    Active member of club or organization: Not on file    Attends meetings of clubs or organizations: Not on file    Relationship status: Not on file  Other Topics Concern  . Not on file  Social History Narrative  . Not on file  FAMILY HISTORY:  We obtained a detailed, 4-generation family history.  Significant diagnoses are listed below: Family History  Problem Relation Age of Onset  . Breast cancer Mother 59       dbl mastectomy  . Heart attack Maternal Uncle   . Autoimmune disease Maternal Grandmother   . Cancer Maternal Grandfather        salivary Young cancer, d. 78  . Colon cancer Paternal Grandfather   . Breast cancer Other        PGMs 3 sisters    The patient has two daughters, one who had NHL at age 20.  She is currently 25.  She has one brother who is cancer free. Both parents are living.  The patient's mother was diagnosed with breast cancer at 52 and had a double mastectomy.  She had five brothers and one sister who cancer free.  The grandparents are deceased.  The grandmother died of an autoimmune disease and the grandfather died  of salivary gland cancer.  The patient's father is an only child.  His father died of colon cancer and bone marrow cancer in his 83's.  The grandmother died early from an accident.  She had 3 sisters with breast cancer.  Holly Young is aware of previous family history of genetic testing for hereditary cancer risks. Patient's maternal ancestors are of [place] descent, and paternal ancestors are of [place] and [place] descent. There is no reported Ashkenazi Jewish ancestry. There is no known consanguinity.  GENETIC COUNSELING ASSESSMENT: Holly Young is a 47 y.o. female with a family history of breast cancer which is somewhat suggestive of a hereditary breast cancer syndrome and predisposition to cancer. We, therefore, discussed and recommended the following at today's visit.   DISCUSSION: We discussed that about 5-10% of breast cancer is due to BRCA mutations, with most cases due to BRCA mutation.  There are other genes associated with hereditary breast cancer syndromes, including ATM, CHEK2 and PALB2.  We reviewed the characteristics, features and inheritance patterns of hereditary cancer syndromes. We also discussed genetic testing, including the appropriate family members to test, the process of testing, insurance coverage and turn-around-time for results. We discussed the implications of a negative, positive and/or variant of uncertain significant result. We recommended Holly Young pursue genetic testing for the multi cancer gene panel. The Multi-Gene Panel offered by [facility] includes sequencing and/or deletion duplication testing of the following 84 genes: AIP, ALK, APC, ATM, AXIN2,BAP1,  BARD1, BLM, BMPR1A, BRCA1, BRCA2, BRIP1, CASR, CDC73, CDH1, CDK4, CDKN1B, CDKN1C, CDKN2A (p14ARF), CDKN2A (p16INK4a), CEBPA, CHEK2, CTNNA1, DICER1, DIS3L2, EGFR (c.2369C>T, p.Thr790Met variant only), EPCAM (Deletion/duplication testing only), FH, FLCN, GATA2, GPC3, GREM1 (Promoter  region deletion/duplication testing only), HOXB13 (c.251G>A, p.Gly84Glu), HRAS, KIT, MAX, MEN1, MET, MITF (c.952G>A, p.Glu318Lys variant only), MLH1, MSH2, MSH3, MSH6, MUTYH, NBN, NF1, NF2, NTHL1, PALB2, PDGFRA, PHOX2B, PMS2, POLD1, POLE, POT1, PRKAR1A, PTCH1, PTEN, RAD50, RAD51C, RAD51D, RB1, RECQL4, RET, RUNX1, SDHAF2, SDHA (sequence changes only), SDHB, SDHC, SDHD, SMAD4, SMARCA4, SMARCB1, SMARCE1, STK11, SUFU, TERC, TERT, TMEM127, TP53, TSC1, TSC2, VHL, WRN and WT1.    Based on Holly Young's family history of cancer, she meets medical criteria for genetic testing. Despite that she meets criteria, she may still have an out of pocket cost. We discussed that if her out of pocket cost for testing is over $100, the laboratory will call and confirm whether she wants to proceed with testing.  If the out of pocket cost of testing is less than $100  she will be billed by the genetic testing laboratory.   PLAN: After considering the risks, benefits, and limitations, Holly Young  provided informed consent to pursue genetic testing and the blood sample was sent to [facility] for analysis of the multi cancer panel. Results should be available within approximately 2-3 weeks' time, at which point they will be disclosed by telephone to Holly Young, as will any additional recommendations warranted by these results. Holly Young will receive a summary of her genetic counseling visit and a copy of her results once available. This information will also be available in Epic. We encouraged Holly Young to remain in contact with cancer genetics annually so that we can continuously update the family history and inform her of any changes in cancer genetics and testing that may be of benefit for her family. Holly Young questions were answered to her satisfaction today. Our contact information was provided should additional questions or concerns arise.  Lastly, we encouraged Holly Young to remain  in contact with cancer genetics annually so that we can continuously update the family history and inform her of any changes in cancer genetics and testing that may be of benefit for this family.   Ms.  Young's questions were answered to her satisfaction today. Our contact information was provided should additional questions or concerns arise. Thank you for the referral and allowing [place] to share in the care of your patient.    P. Florene Glen, [place], [facility] Certified Genetic Counselor [email or web address] phone: [phone]  The patient was seen for a total of 35 minutes in face-to-face genetic counseling.  This patient was discussed with Drs. Magrinat, Lindi Adie and/or Burr Medico who agrees with the above.    _______________________________________________________________________ For Office Staff:  Number of people involved in session: 1 Was an Intern/ student involved with case: no

## 2017-12-26 ENCOUNTER — Encounter (HOSPITAL_COMMUNITY)
Admission: RE | Admit: 2017-12-26 | Discharge: 2017-12-26 | Disposition: A | Discharge status: Home / Self Care | Payer: No Typology Code available for payment source | Source: Ambulatory Visit | Attending: Orthopedic Surgery | Admitting: Orthopedic Surgery

## 2017-12-26 ENCOUNTER — Encounter (HOSPITAL_COMMUNITY): Payer: Self-pay

## 2017-12-26 ENCOUNTER — Other Ambulatory Visit: Payer: Self-pay

## 2017-12-26 DIAGNOSIS — M1712 Unilateral primary osteoarthritis, left knee: Secondary | ICD-10-CM | POA: Insufficient documentation

## 2017-12-26 DIAGNOSIS — Z01812 Encounter for preprocedural laboratory examination: Secondary | ICD-10-CM | POA: Diagnosis present

## 2017-12-26 HISTORY — DX: Family history of other specified conditions: Z84.89

## 2017-12-26 LAB — CBC
HCT: 38.3 % (ref 36.0–46.0)
HEMOGLOBIN: 11.7 g/dL — AB (ref 12.0–15.0)
MCH: 28.4 pg (ref 26.0–34.0)
MCHC: 30.5 g/dL (ref 30.0–36.0)
MCV: 93 fL (ref 80.0–100.0)
NRBC: 0 % (ref 0.0–0.2)
Platelets: 237 10*3/uL (ref 150–400)
RBC: 4.12 MIL/uL (ref 3.87–5.11)
RDW: 14.6 % (ref 11.5–15.5)
WBC: 5.4 10*3/uL (ref 4.0–10.5)

## 2017-12-26 LAB — BASIC METABOLIC PANEL
Anion gap: 8 (ref 5–15)
BUN: 17 mg/dL (ref 6–20)
CO2: 24 mmol/L (ref 22–32)
Calcium: 9.4 mg/dL (ref 8.9–10.3)
Chloride: 105 mmol/L (ref 98–111)
Creatinine, Ser: 0.68 mg/dL (ref 0.44–1.00)
GFR calc non Af Amer: >60 mL/min (ref >60)
Glucose, Bld: 87 mg/dL (ref 70–99)
Potassium: 4.8 mmol/L (ref 3.5–5.1)
Sodium: 137 mmol/L (ref 135–145)

## 2017-12-26 LAB — HCG, SERUM, QUALITATIVE: Preg, Serum: NEGATIVE

## 2017-12-26 LAB — SURGICAL PCR SCREEN
MRSA, PCR: NEGATIVE
Staphylococcus aureus: NEGATIVE

## 2017-12-31 ENCOUNTER — Telehealth: Payer: Self-pay | Admitting: Genetic Counselor

## 2017-12-31 ENCOUNTER — Ambulatory Visit: Payer: Self-pay | Admitting: Orthopedic Surgery

## 2017-12-31 ENCOUNTER — Encounter: Payer: Self-pay | Admitting: Genetic Counselor

## 2017-12-31 DIAGNOSIS — Z1379 Encounter for other screening for genetic and chromosomal anomalies: Secondary | ICD-10-CM | POA: Insufficient documentation

## 2017-12-31 NOTE — Telephone Encounter (Signed)
Revealed negative genetic testing.  Discussed that we do not know why there is cancer in the family. It could be due to a different gene that we are not testing, or maybe our current technology may not be able to pick something up.  It will be important for her to keep in contact with genetics to keep up with whether additional testing may be needed.  

## 2017-12-31 NOTE — H&P (Signed)
PARTIAL KNEE ADMISSION H&P  Patient is being admitted for left knee patellofemoral arthroplasty.  Subjective:  Chief Complaint:left knee pain.  HPI: Holly Young, 47 y.o. female, has a history of pain and functional disability in the left knee due to arthritis and has failed non-surgical conservative treatments for greater than 12 weeks to includeNSAID's and/or analgesics, corticosteriod injections, viscosupplementation injections, flexibility and strengthening excercises, supervised PT with diminished ADL's post treatment, use of assistive devices, weight reduction as appropriate and activity modification.  Onset of symptoms was gradual, starting 5 years ago with gradually worsening course since that time. The patient noted no past surgery on the left knee(s).  Patient currently rates pain in the left knee(s) at 10 out of 10 with activity. Patient has night pain, worsening of pain with activity and weight bearing, pain that interferes with activities of daily living, pain with passive range of motion and crepitus.  Patient has evidence of subchondral cysts, subchondral sclerosis, periarticular osteophytes and joint space narrowing by imaging studies.  There is no active infection.  Patient Active Problem List   Diagnosis Date Noted  . Family history of breast cancer   . Family history of colon cancer   . Patellofemoral arthritis of right knee 11/21/2017   Past Medical History:  Diagnosis Date  . Arthritis   . Family history of adverse reaction to anesthesia    father's aunt had a stroke after surgery -back in early 1990's  . Family history of breast cancer   . Family history of colon cancer   . GERD (gastroesophageal reflux disease)   . Headache    Migraines  . Pneumonia     Past Surgical History:  Procedure Laterality Date  . BREAST BIOPSY Left    22 years ago  . PATELLA-FEMORAL ARTHROPLASTY Right 11/21/2017   Procedure: PATELLA-FEMORAL ARTHROPLASTY;  Surgeon: Rod Can, MD;  Location: [facility];  Service: Orthopedics;  Laterality: Right;    Current Outpatient Medications  Medication Sig Dispense Refill Last Dose  . aspirin 81 MG chewable tablet Chew 1 tablet (81 mg total) by mouth 2 (two) times daily. 60 tablet 1   . benzonatate (TESSALON) 100 MG capsule Take 100 mg by mouth daily as needed for cough.    11/20/2017 at Unknown time  . Cholecalciferol (VITAMIN D3) 50 MCG (2000 UT) TABS Take 2,000 Units by mouth daily.   11/20/2017 at Unknown time  . diclofenac sodium (VOLTAREN) 1 % GEL Apply 2 g topically daily as needed (for pain).      Marland Kitchen diphenhydrAMINE (BENADRYL) 25 MG tablet Take 25 mg by mouth at bedtime.    11/20/2017 at Unknown time  . docusate sodium (COLACE) 100 MG capsule Take 1 capsule (100 mg total) by mouth 2 (two) times daily. 60 capsule 1   . HYDROcodone-acetaminophen (NORCO/VICODIN) 5-325 MG tablet Take 1-2 tablets by mouth every 6 (six) hours as needed for moderate pain. (Patient not taking: Reported on 12/16/2017) 50 tablet 0 Completed Course at Unknown time  . loratadine (CLARITIN) 10 MG tablet Take 10 mg by mouth daily.   11/21/2017 at 0630  . Magnesium Oxide 420 MG TABS Take 840 mg by mouth daily.   11/20/2017 at Unknown time  . Melatonin 3 MG TABS Take 3 mg by mouth at bedtime as needed (for sleep).    Past Week at Unknown time  . ondansetron (ZOFRAN) 4 MG tablet Take 1 tablet (4 mg total) by mouth every 6 (six) hours as needed for nausea. (Patient not  taking: Reported on 12/16/2017) 20 tablet 0 Completed Course at Unknown time  . OVER THE COUNTER MEDICATION Place 3-4 tablets under the tongue daily as needed (for restless legs). Hyland's Restful Legs    Past Week at Unknown time  . polyvinyl alcohol (LIQUIFILM TEARS) 1.4 % ophthalmic solution Place 2 drops into both eyes every morning.    11/20/2017 at Unknown time  . pseudoephedrine (SUDAFED) 120 MG 12 hr tablet Take 120 mg by mouth daily.    11/21/2017 at 0630  . senna (SENOKOT) 8.6 MG  TABS tablet Take 1 tablet (8.6 mg total) by mouth 2 (two) times daily. 120 each 0   . vitamin B-12 (CYANOCOBALAMIN) 500 MCG tablet Take 1,000 mcg by mouth daily.   11/20/2017 at Unknown time   No current facility-administered medications for this visit.    Allergies  Allergen Reactions  . 2-Octylcyanoacrylate [Cyanoacrylate] Other (See Comments)    Derma bond-caused redness at incision site, severe blisters with pustules, severe itching and burning of skin  . Adhesive [Tape] Other (See Comments)    Blisters  . Betadine [Povidone Iodine] Other (See Comments)    Eats her skin away  . Chamomile Other (See Comments)    Tea-throat itches  . Latex Rash and Other (See Comments)    Latex bandages cause irritation.     Social History   Tobacco Use  . Smoking status: Never Smoker  . Smokeless tobacco: Never Used  Substance Use Topics  . Alcohol use: Yes    Alcohol/week: 1.0 standard drinks    Types: 1 Cans of beer per week    Comment: daily-beer, or shot of vodka     Family History  Problem Relation Age of Onset  . Breast cancer Mother 66       dbl mastectomy  . Heart attack Maternal Uncle   . Autoimmune disease Maternal Grandmother   . Cancer Maternal Grandfather        salivary glad cancer, d. 30  . Colon cancer Paternal Grandfather   . Breast cancer Other        PGMs 3 sisters     Review of Systems  Constitutional: Negative.   HENT: Positive for hearing loss.   Eyes: Negative.   Respiratory: Negative.   Cardiovascular: Negative.   Gastrointestinal: Positive for constipation, heartburn, nausea and vomiting.  Genitourinary: Negative.   Musculoskeletal: Positive for joint pain.  Skin: Positive for rash.  Neurological: Positive for dizziness.  Endo/Heme/Allergies: Positive for environmental allergies.  Psychiatric/Behavioral: The patient has insomnia.     Objective:  Physical Exam  Vitals reviewed. Constitutional: She is oriented to person, place, and time. She  appears well-nourished.  HENT:  Head: Normocephalic and atraumatic.  Eyes: Pupils are equal, round, and reactive to light. Conjunctivae and EOM are normal.  Neck: Normal range of motion. Neck supple.  Cardiovascular: Normal rate, regular rhythm and intact distal pulses.  Respiratory: Effort normal. No respiratory distress.  GI: Soft. She exhibits no distension.  Genitourinary:    Genitourinary Comments: deferred   Musculoskeletal:     Left knee: She exhibits decreased range of motion, swelling, abnormal patellar mobility and bony tenderness.  Neurological: She is alert and oriented to person, place, and time. She has normal reflexes.  Skin: Skin is warm and dry.  Psychiatric: She has a normal mood and affect. Her behavior is normal. Judgment and thought content normal.    Vital signs in last 24 hours: @VSRANGES @  Labs:   Estimated body mass index  is 25.67 kg/m as calculated from the following:   Height as of 12/26/17: 5\' 10"  (1.778 m).   Weight as of 12/26/17: 81.1 kg.   Imaging Review Plain radiographs demonstrate severe degenerative joint disease of the patellofemoral compartment left knee(s). The overall alignment isneutral. The bone quality appears to be adequate for age and reported activity level.   Preoperative templating of the joint replacement has been completed, documented, and submitted to the Operating Room personnel in order to optimize intra-operative equipment management.        Assessment/Plan:  End stage patellofemoral arthritis, left knee   The patient history, physical examination, clinical judgment of the provider and imaging studies are consistent with end stage degenerative joint disease of the left knee(s) and patellofemoral knee arthroplasty is deemed medically necessary. The treatment options including medical management, injection therapy arthroscopy and arthroplasty were discussed at length. The risks and benefits of total knee arthroplasty  were presented and reviewed. The risks due to aseptic loosening, infection, stiffness, patella tracking problems, thromboembolic complications and other imponderables were discussed. The patient acknowledged the explanation, agreed to proceed with the plan and consent was signed. Patient is being admitted for inpatient treatment for surgery, pain control, PT, OT, prophylactic antibiotics, VTE prophylaxis, progressive ambulation and ADL's and discharge planning. The patient is planning to be discharged home with home health services

## 2017-12-31 NOTE — H&P (View-Only) (Signed)
PARTIAL KNEE ADMISSION H&P  Patient is being admitted for left knee patellofemoral arthroplasty.  Subjective:  Chief Complaint:left knee pain.  HPI: Holly Young, 47 y.o. female, has a history of pain and functional disability in the left knee due to arthritis and has failed non-surgical conservative treatments for greater than 12 weeks to includeNSAID's and/or analgesics, corticosteriod injections, viscosupplementation injections, flexibility and strengthening excercises, supervised PT with diminished ADL's post treatment, use of assistive devices, weight reduction as appropriate and activity modification.  Onset of symptoms was gradual, starting 5 years ago with gradually worsening course since that time. The patient noted no past surgery on the left knee(s).  Patient currently rates pain in the left knee(s) at 10 out of 10 with activity. Patient has night pain, worsening of pain with activity and weight bearing, pain that interferes with activities of daily living, pain with passive range of motion and crepitus.  Patient has evidence of subchondral cysts, subchondral sclerosis, periarticular osteophytes and joint space narrowing by imaging studies.  There is no active infection.  Patient Active Problem List   Diagnosis Date Noted  . Family history of breast cancer   . Family history of colon cancer   . Patellofemoral arthritis of right knee 11/21/2017   Past Medical History:  Diagnosis Date  . Arthritis   . Family history of adverse reaction to anesthesia    father's aunt had a stroke after surgery -back in early 1990's  . Family history of breast cancer   . Family history of colon cancer   . GERD (gastroesophageal reflux disease)   . Headache    Migraines  . Pneumonia     Past Surgical History:  Procedure Laterality Date  . BREAST BIOPSY Left    22 years ago  . PATELLA-FEMORAL ARTHROPLASTY Right 11/21/2017   Procedure: PATELLA-FEMORAL ARTHROPLASTY;  Surgeon: Rod Can, MD;  Location: [facility];  Service: Orthopedics;  Laterality: Right;    Current Outpatient Medications  Medication Sig Dispense Refill Last Dose  . aspirin 81 MG chewable tablet Chew 1 tablet (81 mg total) by mouth 2 (two) times daily. 60 tablet 1   . benzonatate (TESSALON) 100 MG capsule Take 100 mg by mouth daily as needed for cough.    11/20/2017 at Unknown time  . Cholecalciferol (VITAMIN D3) 50 MCG (2000 UT) TABS Take 2,000 Units by mouth daily.   11/20/2017 at Unknown time  . diclofenac sodium (VOLTAREN) 1 % GEL Apply 2 g topically daily as needed (for pain).      Marland Kitchen diphenhydrAMINE (BENADRYL) 25 MG tablet Take 25 mg by mouth at bedtime.    11/20/2017 at Unknown time  . docusate sodium (COLACE) 100 MG capsule Take 1 capsule (100 mg total) by mouth 2 (two) times daily. 60 capsule 1   . HYDROcodone-acetaminophen (NORCO/VICODIN) 5-325 MG tablet Take 1-2 tablets by mouth every 6 (six) hours as needed for moderate pain. (Patient not taking: Reported on 12/16/2017) 50 tablet 0 Completed Course at Unknown time  . loratadine (CLARITIN) 10 MG tablet Take 10 mg by mouth daily.   11/21/2017 at 0630  . Magnesium Oxide 420 MG TABS Take 840 mg by mouth daily.   11/20/2017 at Unknown time  . Melatonin 3 MG TABS Take 3 mg by mouth at bedtime as needed (for sleep).    Past Week at Unknown time  . ondansetron (ZOFRAN) 4 MG tablet Take 1 tablet (4 mg total) by mouth every 6 (six) hours as needed for nausea. (Patient not  taking: Reported on 12/16/2017) 20 tablet 0 Completed Course at Unknown time  . OVER THE COUNTER MEDICATION Place 3-4 tablets under the tongue daily as needed (for restless legs). Hyland's Restful Legs    Past Week at Unknown time  . polyvinyl alcohol (LIQUIFILM TEARS) 1.4 % ophthalmic solution Place 2 drops into both eyes every morning.    11/20/2017 at Unknown time  . pseudoephedrine (SUDAFED) 120 MG 12 hr tablet Take 120 mg by mouth daily.    11/21/2017 at 0630  . senna (SENOKOT) 8.6 MG  TABS tablet Take 1 tablet (8.6 mg total) by mouth 2 (two) times daily. 120 each 0   . vitamin B-12 (CYANOCOBALAMIN) 500 MCG tablet Take 1,000 mcg by mouth daily.   11/20/2017 at Unknown time   No current facility-administered medications for this visit.    Allergies  Allergen Reactions  . 2-Octylcyanoacrylate [Cyanoacrylate] Other (See Comments)    Derma bond-caused redness at incision site, severe blisters with pustules, severe itching and burning of skin  . Adhesive [Tape] Other (See Comments)    Blisters  . Betadine [Povidone Iodine] Other (See Comments)    Eats her skin away  . Chamomile Other (See Comments)    Tea-throat itches  . Latex Rash and Other (See Comments)    Latex bandages cause irritation.     Social History   Tobacco Use  . Smoking status: Never Smoker  . Smokeless tobacco: Never Used  Substance Use Topics  . Alcohol use: Yes    Alcohol/week: 1.0 standard drinks    Types: 1 Cans of beer per week    Comment: daily-beer, or shot of vodka     Family History  Problem Relation Age of Onset  . Breast cancer Mother 75       dbl mastectomy  . Heart attack Maternal Uncle   . Autoimmune disease Maternal Grandmother   . Cancer Maternal Grandfather        salivary glad cancer, d. 88  . Colon cancer Paternal Grandfather   . Breast cancer Other        PGMs 3 sisters     Review of Systems  Constitutional: Negative.   HENT: Positive for hearing loss.   Eyes: Negative.   Respiratory: Negative.   Cardiovascular: Negative.   Gastrointestinal: Positive for constipation, heartburn, nausea and vomiting.  Genitourinary: Negative.   Musculoskeletal: Positive for joint pain.  Skin: Positive for rash.  Neurological: Positive for dizziness.  Endo/Heme/Allergies: Positive for environmental allergies.  Psychiatric/Behavioral: The patient has insomnia.     Objective:  Physical Exam  Vitals reviewed. Constitutional: She is oriented to person, place, and time. She  appears well-nourished.  HENT:  Head: Normocephalic and atraumatic.  Eyes: Pupils are equal, round, and reactive to light. Conjunctivae and EOM are normal.  Neck: Normal range of motion. Neck supple.  Cardiovascular: Normal rate, regular rhythm and intact distal pulses.  Respiratory: Effort normal. No respiratory distress.  GI: Soft. She exhibits no distension.  Genitourinary:    Genitourinary Comments: deferred   Musculoskeletal:     Left knee: She exhibits decreased range of motion, swelling, abnormal patellar mobility and bony tenderness.  Neurological: She is alert and oriented to person, place, and time. She has normal reflexes.  Skin: Skin is warm and dry.  Psychiatric: She has a normal mood and affect. Her behavior is normal. Judgment and thought content normal.    Vital signs in last 24 hours: @VSRANGES @  Labs:   Estimated body mass index  is 25.67 kg/m as calculated from the following:   Height as of 12/26/17: 5\' 10"  (1.778 m).   Weight as of 12/26/17: 81.1 kg.   Imaging Review Plain radiographs demonstrate severe degenerative joint disease of the patellofemoral compartment left knee(s). The overall alignment isneutral. The bone quality appears to be adequate for age and reported activity level.   Preoperative templating of the joint replacement has been completed, documented, and submitted to the Operating Room personnel in order to optimize intra-operative equipment management.        Assessment/Plan:  End stage patellofemoral arthritis, left knee   The patient history, physical examination, clinical judgment of the provider and imaging studies are consistent with end stage degenerative joint disease of the left knee(s) and patellofemoral knee arthroplasty is deemed medically necessary. The treatment options including medical management, injection therapy arthroscopy and arthroplasty were discussed at length. The risks and benefits of total knee arthroplasty  were presented and reviewed. The risks due to aseptic loosening, infection, stiffness, patella tracking problems, thromboembolic complications and other imponderables were discussed. The patient acknowledged the explanation, agreed to proceed with the plan and consent was signed. Patient is being admitted for inpatient treatment for surgery, pain control, PT, OT, prophylactic antibiotics, VTE prophylaxis, progressive ambulation and ADL's and discharge planning. The patient is planning to be discharged home with home health services

## 2018-01-02 ENCOUNTER — Inpatient Hospital Stay (HOSPITAL_COMMUNITY): Payer: No Typology Code available for payment source | Admitting: Certified Registered Nurse Anesthetist

## 2018-01-02 ENCOUNTER — Encounter (HOSPITAL_COMMUNITY)
Admission: RE | Disposition: A | Discharge status: Home Health | Payer: Self-pay | Source: Home / Self Care | Attending: Orthopedic Surgery

## 2018-01-02 ENCOUNTER — Inpatient Hospital Stay (HOSPITAL_COMMUNITY): Payer: No Typology Code available for payment source

## 2018-01-02 ENCOUNTER — Other Ambulatory Visit: Payer: Self-pay

## 2018-01-02 ENCOUNTER — Encounter (HOSPITAL_COMMUNITY): Payer: Self-pay | Admitting: Emergency Medicine

## 2018-01-02 ENCOUNTER — Inpatient Hospital Stay (HOSPITAL_COMMUNITY)
Admission: RE | Admit: 2018-01-02 | Discharge: 2018-01-03 | DRG: 470 | Disposition: A | Discharge status: Home Health | Payer: No Typology Code available for payment source | Attending: Orthopedic Surgery | Admitting: Orthopedic Surgery

## 2018-01-02 DIAGNOSIS — M1712 Unilateral primary osteoarthritis, left knee: Principal | ICD-10-CM | POA: Diagnosis present

## 2018-01-02 DIAGNOSIS — Z91040 Latex allergy status: Secondary | ICD-10-CM

## 2018-01-02 DIAGNOSIS — Z888 Allergy status to other drugs, medicaments and biological substances status: Secondary | ICD-10-CM

## 2018-01-02 DIAGNOSIS — Z8701 Personal history of pneumonia (recurrent): Secondary | ICD-10-CM | POA: Diagnosis not present

## 2018-01-02 DIAGNOSIS — Z803 Family history of malignant neoplasm of breast: Secondary | ICD-10-CM

## 2018-01-02 DIAGNOSIS — Z91018 Allergy to other foods: Secondary | ICD-10-CM

## 2018-01-02 DIAGNOSIS — Z7982 Long term (current) use of aspirin: Secondary | ICD-10-CM

## 2018-01-02 DIAGNOSIS — Z800 Family history of malignant neoplasm of digestive organs: Secondary | ICD-10-CM

## 2018-01-02 DIAGNOSIS — Z79890 Hormone replacement therapy (postmenopausal): Secondary | ICD-10-CM | POA: Diagnosis not present

## 2018-01-02 DIAGNOSIS — K219 Gastro-esophageal reflux disease without esophagitis: Secondary | ICD-10-CM | POA: Diagnosis present

## 2018-01-02 DIAGNOSIS — Z91048 Other nonmedicinal substance allergy status: Secondary | ICD-10-CM | POA: Diagnosis not present

## 2018-01-02 DIAGNOSIS — Z79899 Other long term (current) drug therapy: Secondary | ICD-10-CM | POA: Diagnosis not present

## 2018-01-02 DIAGNOSIS — Z96652 Presence of left artificial knee joint: Secondary | ICD-10-CM

## 2018-01-02 HISTORY — PX: PATELLA-FEMORAL ARTHROPLASTY: SHX5037

## 2018-01-02 LAB — TYPE AND SCREEN
ABO/RH(D): O NEG
Antibody Screen: NEGATIVE

## 2018-01-02 SURGERY — ARTHROPLASTY, PATELLOFEMORAL
Anesthesia: Spinal | Site: Knee | Laterality: Left

## 2018-01-02 MED ORDER — METHOCARBAMOL 500 MG PO TABS
500.0000 mg | ORAL_TABLET | Freq: Four times a day (QID) | ORAL | Status: DC | PRN
  Administered 2018-01-03 (×3): 500 mg via ORAL
  Filled 2018-01-02 (×3): qty 1

## 2018-01-02 MED ORDER — FENTANYL CITRATE (PF) 100 MCG/2ML IJ SOLN
INTRAMUSCULAR | Status: DC | PRN
  Administered 2018-01-02 (×2): 50 ug via INTRAVENOUS

## 2018-01-02 MED ORDER — MAGNESIUM OXIDE 400 (241.3 MG) MG PO TABS
800.0000 mg | ORAL_TABLET | Freq: Every day | ORAL | Status: DC
  Administered 2018-01-02 – 2018-01-03 (×2): 800 mg via ORAL
  Filled 2018-01-02 (×2): qty 2

## 2018-01-02 MED ORDER — METOCLOPRAMIDE HCL 5 MG/ML IJ SOLN: 5.0000 mg | Freq: Three times a day (TID) | INTRAMUSCULAR | Status: DC | PRN

## 2018-01-02 MED ORDER — MAGNESIUM OXIDE 420 MG PO TABS: 840.0000 mg | ORAL_TABLET | Freq: Every day | ORAL | Status: DC

## 2018-01-02 MED ORDER — METHOCARBAMOL 500 MG IVPB - SIMPLE MED
500.0000 mg | Freq: Four times a day (QID) | INTRAVENOUS | Status: DC | PRN
  Administered 2018-01-02: 500 mg via INTRAVENOUS
  Filled 2018-01-02: qty 50

## 2018-01-02 MED ORDER — SODIUM CHLORIDE 0.9 % IR SOLN
Status: DC | PRN
  Administered 2018-01-02: 3000 mL
  Administered 2018-01-02: 1000 mL

## 2018-01-02 MED ORDER — DIPHENHYDRAMINE HCL 12.5 MG/5ML PO ELIX: 12.5000 mg | ORAL_SOLUTION | ORAL | Status: DC | PRN

## 2018-01-02 MED ORDER — PROPOFOL 500 MG/50ML IV EMUL
INTRAVENOUS | Status: DC | PRN
  Administered 2018-01-02: 100 ug/kg/min via INTRAVENOUS

## 2018-01-02 MED ORDER — FENTANYL CITRATE (PF) 100 MCG/2ML IJ SOLN
INTRAMUSCULAR | Status: AC
  Filled 2018-01-02: qty 2

## 2018-01-02 MED ORDER — KETOROLAC TROMETHAMINE 30 MG/ML IJ SOLN
INTRAMUSCULAR | Status: DC | PRN
  Administered 2018-01-02: 30 mg

## 2018-01-02 MED ORDER — ACETAMINOPHEN 10 MG/ML IV SOLN
1000.0000 mg | INTRAVENOUS | Status: AC
  Administered 2018-01-02: 1000 mg via INTRAVENOUS
  Filled 2018-01-02: qty 100

## 2018-01-02 MED ORDER — 0.9 % SODIUM CHLORIDE (POUR BTL) OPTIME
TOPICAL | Status: DC | PRN
  Administered 2018-01-02: 1000 mL

## 2018-01-02 MED ORDER — ROPIVACAINE HCL 7.5 MG/ML IJ SOLN
INTRAMUSCULAR | Status: DC | PRN
  Administered 2018-01-02: 20 mL via PERINEURAL

## 2018-01-02 MED ORDER — VITAMIN D 25 MCG (1000 UNIT) PO TABS
2000.0000 [IU] | ORAL_TABLET | Freq: Every day | ORAL | Status: DC
  Administered 2018-01-03: 2000 [IU] via ORAL
  Filled 2018-01-02: qty 2

## 2018-01-02 MED ORDER — METHOCARBAMOL 500 MG IVPB - SIMPLE MED
INTRAVENOUS | Status: AC
  Filled 2018-01-02: qty 50

## 2018-01-02 MED ORDER — PROPOFOL 10 MG/ML IV BOLUS
INTRAVENOUS | Status: AC
  Filled 2018-01-02: qty 20

## 2018-01-02 MED ORDER — HYDROCODONE-ACETAMINOPHEN 7.5-325 MG PO TABS
1.0000 | ORAL_TABLET | ORAL | Status: DC | PRN
  Administered 2018-01-02: 2 via ORAL
  Administered 2018-01-02: 1 via ORAL
  Administered 2018-01-03 (×3): 2 via ORAL
  Filled 2018-01-02 (×2): qty 2
  Filled 2018-01-02: qty 1
  Filled 2018-01-02 (×2): qty 2

## 2018-01-02 MED ORDER — VITAMIN B-12 1000 MCG PO TABS
1000.0000 ug | ORAL_TABLET | Freq: Every day | ORAL | Status: DC
  Administered 2018-01-03: 1000 ug via ORAL
  Filled 2018-01-02: qty 1

## 2018-01-02 MED ORDER — HYDROCODONE-ACETAMINOPHEN 5-325 MG PO TABS
1.0000 | ORAL_TABLET | ORAL | Status: DC | PRN
  Administered 2018-01-02: 1 via ORAL
  Filled 2018-01-02: qty 1

## 2018-01-02 MED ORDER — CEFAZOLIN SODIUM-DEXTROSE 2-4 GM/100ML-% IV SOLN
2.0000 g | INTRAVENOUS | Status: AC
  Administered 2018-01-02: 2 g via INTRAVENOUS
  Filled 2018-01-02: qty 100

## 2018-01-02 MED ORDER — TRANEXAMIC ACID-NACL 1000-0.7 MG/100ML-% IV SOLN
1000.0000 mg | INTRAVENOUS | Status: AC
  Administered 2018-01-02: 1000 mg via INTRAVENOUS
  Filled 2018-01-02: qty 100

## 2018-01-02 MED ORDER — ONDANSETRON HCL 4 MG/2ML IJ SOLN
INTRAMUSCULAR | Status: DC | PRN
  Administered 2018-01-02: 4 mg via INTRAVENOUS

## 2018-01-02 MED ORDER — BENZONATATE 100 MG PO CAPS: 100.0000 mg | ORAL_CAPSULE | Freq: Every day | ORAL | Status: DC | PRN

## 2018-01-02 MED ORDER — ONDANSETRON HCL 4 MG/2ML IJ SOLN
INTRAMUSCULAR | Status: AC
  Filled 2018-01-02: qty 2

## 2018-01-02 MED ORDER — PHENOL 1.4 % MT LIQD
1.0000 | OROMUCOSAL | Status: DC | PRN
  Filled 2018-01-02: qty 177

## 2018-01-02 MED ORDER — SENNA 8.6 MG PO TABS
1.0000 | ORAL_TABLET | Freq: Two times a day (BID) | ORAL | Status: DC
  Administered 2018-01-02 – 2018-01-03 (×2): 8.6 mg via ORAL
  Filled 2018-01-02 (×2): qty 1

## 2018-01-02 MED ORDER — ISOPROPYL ALCOHOL 70 % SOLN
Status: AC
  Filled 2018-01-02: qty 480

## 2018-01-02 MED ORDER — MORPHINE SULFATE (PF) 2 MG/ML IV SOLN: 0.5000 mg | INTRAVENOUS | Status: DC | PRN

## 2018-01-02 MED ORDER — ONDANSETRON HCL 4 MG/2ML IJ SOLN
4.0000 mg | Freq: Four times a day (QID) | INTRAMUSCULAR | Status: DC | PRN
  Administered 2018-01-03: 4 mg via INTRAVENOUS
  Filled 2018-01-02: qty 2

## 2018-01-02 MED ORDER — BUPIVACAINE IN DEXTROSE 0.75-8.25 % IT SOLN
INTRATHECAL | Status: DC | PRN
  Administered 2018-01-02: 1 mL via INTRATHECAL

## 2018-01-02 MED ORDER — KETOROLAC TROMETHAMINE 30 MG/ML IJ SOLN
INTRAMUSCULAR | Status: AC
  Filled 2018-01-02: qty 1

## 2018-01-02 MED ORDER — VITAMIN D3 50 MCG (2000 UT) PO TABS: 2000.0000 [IU] | ORAL_TABLET | Freq: Every day | ORAL | Status: DC

## 2018-01-02 MED ORDER — CHLORHEXIDINE GLUCONATE 4 % EX LIQD: 60.0000 mL | Freq: Once | CUTANEOUS | Status: DC

## 2018-01-02 MED ORDER — ASPIRIN 81 MG PO CHEW
81.0000 mg | CHEWABLE_TABLET | Freq: Two times a day (BID) | ORAL | Status: DC
  Administered 2018-01-02 – 2018-01-03 (×2): 81 mg via ORAL
  Filled 2018-01-02 (×2): qty 1

## 2018-01-02 MED ORDER — PROPOFOL 10 MG/ML IV BOLUS
INTRAVENOUS | Status: AC
  Filled 2018-01-02: qty 40

## 2018-01-02 MED ORDER — MENTHOL 3 MG MT LOZG: 1.0000 | LOZENGE | OROMUCOSAL | Status: DC | PRN

## 2018-01-02 MED ORDER — LORATADINE 10 MG PO TABS
10.0000 mg | ORAL_TABLET | Freq: Every day | ORAL | Status: DC
  Administered 2018-01-03: 10 mg via ORAL
  Filled 2018-01-02: qty 1

## 2018-01-02 MED ORDER — POLYVINYL ALCOHOL 1.4 % OP SOLN
2.0000 [drp] | Freq: Every day | OPHTHALMIC | Status: DC
  Administered 2018-01-03: 2 [drp] via OPHTHALMIC
  Filled 2018-01-02: qty 15

## 2018-01-02 MED ORDER — ISOPROPYL ALCOHOL 70 % SOLN
Status: DC | PRN
  Administered 2018-01-02: 1 via TOPICAL

## 2018-01-02 MED ORDER — SODIUM CHLORIDE (PF) 0.9 % IJ SOLN
INTRAMUSCULAR | Status: AC
  Filled 2018-01-02: qty 50

## 2018-01-02 MED ORDER — SODIUM CHLORIDE 0.9 % IV SOLN: INTRAVENOUS | Status: DC

## 2018-01-02 MED ORDER — ACETAMINOPHEN 325 MG PO TABS: 325.0000 mg | ORAL_TABLET | Freq: Four times a day (QID) | ORAL | Status: DC | PRN

## 2018-01-02 MED ORDER — BUPIVACAINE-EPINEPHRINE 0.25% -1:200000 IJ SOLN
INTRAMUSCULAR | Status: DC | PRN
  Administered 2018-01-02: 30 mL

## 2018-01-02 MED ORDER — STERILE WATER FOR IRRIGATION IR SOLN
Status: DC | PRN
  Administered 2018-01-02: 2000 mL

## 2018-01-02 MED ORDER — MIDAZOLAM HCL 2 MG/2ML IJ SOLN
INTRAMUSCULAR | Status: AC
  Filled 2018-01-02: qty 2

## 2018-01-02 MED ORDER — BUPIVACAINE-EPINEPHRINE (PF) 0.25% -1:200000 IJ SOLN
INTRAMUSCULAR | Status: AC
  Filled 2018-01-02: qty 30

## 2018-01-02 MED ORDER — CEFAZOLIN SODIUM-DEXTROSE 2-4 GM/100ML-% IV SOLN
2.0000 g | Freq: Four times a day (QID) | INTRAVENOUS | Status: AC
  Administered 2018-01-02 (×2): 2 g via INTRAVENOUS
  Filled 2018-01-02 (×2): qty 100

## 2018-01-02 MED ORDER — VITAMIN B-12 500 MCG PO TABS: 1000.0000 ug | ORAL_TABLET | Freq: Every day | ORAL | Status: DC

## 2018-01-02 MED ORDER — LACTATED RINGERS IV SOLN
INTRAVENOUS | Status: DC
  Administered 2018-01-02: 07:00:00 via INTRAVENOUS

## 2018-01-02 MED ORDER — METOCLOPRAMIDE HCL 5 MG PO TABS: 5.0000 mg | ORAL_TABLET | Freq: Three times a day (TID) | ORAL | Status: DC | PRN

## 2018-01-02 MED ORDER — SODIUM CHLORIDE 0.9 % IV SOLN
INTRAVENOUS | Status: DC
  Administered 2018-01-02 (×2): via INTRAVENOUS

## 2018-01-02 MED ORDER — SODIUM CHLORIDE 0.9% FLUSH
INTRAVENOUS | Status: DC | PRN
  Administered 2018-01-02: 30 mL

## 2018-01-02 MED ORDER — FENTANYL CITRATE (PF) 100 MCG/2ML IJ SOLN
INTRAMUSCULAR | Status: AC
  Filled 2018-01-02: qty 4

## 2018-01-02 MED ORDER — DEXAMETHASONE SODIUM PHOSPHATE 10 MG/ML IJ SOLN
10.0000 mg | Freq: Once | INTRAMUSCULAR | Status: AC
  Administered 2018-01-03: 10 mg via INTRAVENOUS
  Filled 2018-01-02: qty 1

## 2018-01-02 MED ORDER — KETOROLAC TROMETHAMINE 15 MG/ML IJ SOLN
15.0000 mg | Freq: Four times a day (QID) | INTRAMUSCULAR | Status: AC
  Administered 2018-01-02 – 2018-01-03 (×3): 15 mg via INTRAVENOUS
  Filled 2018-01-02 (×3): qty 1

## 2018-01-02 MED ORDER — DOCUSATE SODIUM 100 MG PO CAPS
100.0000 mg | ORAL_CAPSULE | Freq: Two times a day (BID) | ORAL | Status: DC
  Administered 2018-01-02 – 2018-01-03 (×2): 100 mg via ORAL
  Filled 2018-01-02 (×2): qty 1

## 2018-01-02 MED ORDER — ALUM & MAG HYDROXIDE-SIMETH 200-200-20 MG/5ML PO SUSP: 30.0000 mL | ORAL | Status: DC | PRN

## 2018-01-02 MED ORDER — MIDAZOLAM HCL 5 MG/5ML IJ SOLN
INTRAMUSCULAR | Status: DC | PRN
  Administered 2018-01-02 (×2): 1 mg via INTRAVENOUS

## 2018-01-02 MED ORDER — PROPOFOL 10 MG/ML IV BOLUS
INTRAVENOUS | Status: DC | PRN
  Administered 2018-01-02 (×2): 20 mg via INTRAVENOUS
  Administered 2018-01-02: 10 mg via INTRAVENOUS

## 2018-01-02 MED ORDER — FENTANYL CITRATE (PF) 100 MCG/2ML IJ SOLN
25.0000 ug | INTRAMUSCULAR | Status: DC | PRN
  Administered 2018-01-02 (×2): 50 ug via INTRAVENOUS

## 2018-01-02 MED ORDER — PROMETHAZINE HCL 25 MG/ML IJ SOLN: 6.2500 mg | INTRAMUSCULAR | Status: DC | PRN

## 2018-01-02 MED ORDER — CLONIDINE HCL (ANALGESIA) 100 MCG/ML EP SOLN
EPIDURAL | Status: DC | PRN
  Administered 2018-01-02: 50 ug

## 2018-01-02 MED ORDER — ONDANSETRON HCL 4 MG PO TABS
4.0000 mg | ORAL_TABLET | Freq: Four times a day (QID) | ORAL | Status: DC | PRN
  Administered 2018-01-02: 4 mg via ORAL
  Filled 2018-01-02: qty 1

## 2018-01-02 MED ORDER — POLYETHYLENE GLYCOL 3350 17 G PO PACK: 17.0000 g | PACK | Freq: Every day | ORAL | Status: DC | PRN

## 2018-01-02 SURGICAL SUPPLY — 67 items
BAG ZIPLOCK 12X15 (MISCELLANEOUS) IMPLANT
BANDAGE ACE 4X5 VEL STRL LF (GAUZE/BANDAGES/DRESSINGS) ×3 IMPLANT
BANDAGE ACE 6X5 VEL STRL LF (GAUZE/BANDAGES/DRESSINGS) ×3 IMPLANT
BANDAGE ELASTIC 4 VELCRO ST LF (GAUZE/BANDAGES/DRESSINGS) ×2 IMPLANT
BNDG ELASTIC 6X10 VLCR STRL LF (GAUZE/BANDAGES/DRESSINGS) ×2 IMPLANT
BUR SURG PFJ MILL NEXGEN (Knees) IMPLANT
BURR SURG PFJ MILL NEXGEN (Knees) ×2 IMPLANT
CEMENT BONE R 1X40 (Cement) ×3 IMPLANT
CHLORAPREP W/TINT 26ML (MISCELLANEOUS) ×6 IMPLANT
COMP FEM NEXGEN SZ2 +3.5 LT (Knees) ×3 IMPLANT
COMP PATELLAR 29 STD 8 THK (Orthopedic Implant) IMPLANT
COMPONENT FEM NEXGN SZ2 +3.5LT (Knees) IMPLANT
COVER SURGICAL LIGHT HANDLE (MISCELLANEOUS) ×3 IMPLANT
COVER WAND RF STERILE (DRAPES) ×2 IMPLANT
CUFF TOURN SGL QUICK 34 (TOURNIQUET CUFF) ×2
CUFF TRNQT CYL 34X4X40X1 (TOURNIQUET CUFF) ×1 IMPLANT
DECANTER SPIKE VIAL GLASS SM (MISCELLANEOUS) ×6 IMPLANT
DERMABOND ADVANCED (GAUZE/BANDAGES/DRESSINGS) ×2
DERMABOND ADVANCED .7 DNX12 (GAUZE/BANDAGES/DRESSINGS) ×2 IMPLANT
DRAPE SHEET LG 3/4 BI-LAMINATE (DRAPES) ×9 IMPLANT
DRAPE U-SHAPE 47X51 STRL (DRAPES) ×3 IMPLANT
DRESSING AQUACEL AG SP 3.5X10 (GAUZE/BANDAGES/DRESSINGS) IMPLANT
DRSG AQUACEL AG ADV 3.5X10 (GAUZE/BANDAGES/DRESSINGS) ×3 IMPLANT
DRSG AQUACEL AG SP 3.5X10 (GAUZE/BANDAGES/DRESSINGS) ×3
DRSG TEGADERM 4X4.75 (GAUZE/BANDAGES/DRESSINGS) IMPLANT
ELECT BLADE TIP CTD 4 INCH (ELECTRODE) ×3 IMPLANT
ELECT REM PT RETURN 15FT ADLT (MISCELLANEOUS) ×3 IMPLANT
EVACUATOR 1/8 PVC DRAIN (DRAIN) IMPLANT
GAUZE SPONGE 4X4 12PLY STRL (GAUZE/BANDAGES/DRESSINGS) ×3 IMPLANT
GLOVE BIO SURGEON STRL SZ8.5 (GLOVE) ×6 IMPLANT
GLOVE BIOGEL PI IND STRL 8.5 (GLOVE) ×1 IMPLANT
GLOVE BIOGEL PI INDICATOR 8.5 (GLOVE) ×2
GOWN SPEC L3 XXLG W/TWL (GOWN DISPOSABLE) ×3 IMPLANT
HANDPIECE INTERPULSE COAX TIP (DISPOSABLE) ×2
HOOD PEEL AWAY FLYTE STAYCOOL (MISCELLANEOUS) ×2 IMPLANT
MARKER SKIN DUAL TIP RULER LAB (MISCELLANEOUS) ×3 IMPLANT
MIS quad sparing headed screw 33mm ×6 IMPLANT
NDL SPNL 18GX3.5 QUINCKE PK (NEEDLE) ×1 IMPLANT
NEEDLE SPNL 18GX3.5 QUINCKE PK (NEEDLE) ×3 IMPLANT
NS IRRIG 1000ML POUR BTL (IV SOLUTION) ×3 IMPLANT
PACK TOTAL KNEE CUSTOM (KITS) ×3 IMPLANT
PADDING CAST COTTON 6X4 STRL (CAST SUPPLIES) ×3 IMPLANT
PATELLA ZIMMER 29MM (Orthopedic Implant) ×3 IMPLANT
PROTECTOR NERVE ULNAR (MISCELLANEOUS) ×3 IMPLANT
SAW OSC TIP CART 19.5X105X1.3 (SAW) ×3 IMPLANT
SCREW HEADED 33MM KNEE (MISCELLANEOUS) ×6 IMPLANT
SET HNDPC FAN SPRY TIP SCT (DISPOSABLE) ×1 IMPLANT
SET PAD KNEE POSITIONER (MISCELLANEOUS) ×3 IMPLANT
SPONGE DRAIN TRACH 4X4 STRL 2S (GAUZE/BANDAGES/DRESSINGS) IMPLANT
SPONGE LAP 18X18 RF (DISPOSABLE) IMPLANT
SUT MNCRL AB 3-0 PS2 18 (SUTURE) ×3 IMPLANT
SUT MNCRL AB 4-0 PS2 18 (SUTURE) ×3 IMPLANT
SUT MON AB 2-0 CT1 36 (SUTURE) ×6 IMPLANT
SUT STRATAFIX PDO 1 14 VIOLET (SUTURE) ×2
SUT STRATFX PDO 1 14 VIOLET (SUTURE) ×1
SUT VIC AB 1 CTX 36 (SUTURE) ×4
SUT VIC AB 1 CTX36XBRD ANBCTR (SUTURE) ×2 IMPLANT
SUT VIC AB 2-0 CT1 27 (SUTURE) ×4
SUT VIC AB 2-0 CT1 TAPERPNT 27 (SUTURE) ×1 IMPLANT
SUTURE STRATFX PDO 1 14 VIOLET (SUTURE) ×1 IMPLANT
SYR 50ML LL SCALE MARK (SYRINGE) ×3 IMPLANT
TOWER CARTRIDGE SMART MIX (DISPOSABLE) ×2 IMPLANT
TRAY FOLEY MTR SLVR 16FR STAT (SET/KITS/TRAYS/PACK) IMPLANT
WATER STERILE IRR 1000ML POUR (IV SOLUTION) ×3 IMPLANT
WRAP KNEE MAXI GEL POST OP (GAUZE/BANDAGES/DRESSINGS) ×3 IMPLANT
YANKAUER SUCT BULB TIP 10FT TU (MISCELLANEOUS) ×3 IMPLANT
patello-femoral joint miling burr ×2 IMPLANT

## 2018-01-02 NOTE — Anesthesia Procedure Notes (Signed)
Spinal  Patient location during procedure: OR Start time: 01/02/2018 7:39 AM End time: 01/02/2018 7:42 AM Staffing Anesthesiologist: Catalina Gravel, MD Performed: anesthesiologist  Preanesthetic Checklist Completed: patient identified, surgical consent, pre-op evaluation, timeout performed, IV checked, risks and benefits discussed and monitors and equipment checked Spinal Block Patient position: sitting Prep: site prepped and draped and DuraPrep Patient monitoring: continuous pulse ox and blood pressure Approach: midline Location: L3-4 Injection technique: single-shot Needle Needle type: Pencan  Needle gauge: 24 G Additional Notes Attempt x1 by CRNA. Attempt x1 by MDA.

## 2018-01-02 NOTE — Discharge Instructions (Signed)
Dr. Rod Can Total Joint Specialist [facility] [address] [place], [place] [address] [phone]  KNEE REPLACEMENT POSTOPERATIVE DIRECTIONS    Knee Rehabilitation, Guidelines Following Surgery  Results after knee surgery are often greatly improved when you follow the exercise, range of motion and muscle strengthening exercises prescribed by your doctor. Safety measures are also important to protect the knee from further injury. Any time any of these exercises cause you to have increased pain or swelling in your knee joint, decrease the amount until you are comfortable again and slowly increase them. If you have problems or questions, call your caregiver or physical therapist for advice.   WEIGHT BEARING Weight bearing as tolerated with assist device (walker, cane, etc) as directed, use it as long as suggested by your surgeon or therapist, typically at least 4-6 weeks.  HOME CARE INSTRUCTIONS  Remove items at home which could result in a fall. This includes throw rugs or furniture in walking pathways.  Continue medications as instructed at time of discharge. You may have some home medications which will be placed on hold until you complete the course of blood thinner medication.  You may start showering once you are discharged home but do not submerge the incision under water. Just pat the incision dry and apply a dry gauze dressing on daily. Walk with walker as instructed.  You may resume a sexual relationship in one month or when given the OK by your doctor.   Use walker as long as suggested by your caregivers.  Avoid periods of inactivity such as sitting longer than an hour when not asleep. This helps prevent blood clots.  You may put full weight on your legs and walk as much as is comfortable.  You may return to work once you are cleared by your doctor.  Do not drive a car for 6 weeks or until released by you surgeon.   Do not drive while  taking narcotics.  Wear the elastic stockings for three weeks following surgery during the day but you may remove then at night. Make sure you keep all of your appointments after your operation with all of your doctors and caregivers. You should call the office at the above phone number and make an appointment for approximately two weeks after the date of your surgery. Do not remove your surgical dressing. The dressing is waterproof; you may take showers in 3 days, but do not take tub baths or submerge the dressing. Please pick up a stool softener and laxative for home use as long as you are requiring pain medications.  ICE to the affected knee every three hours for 30 minutes at a time and then as needed for pain and swelling.  Continue to use ice on the knee for pain and swelling from surgery. You may notice swelling that will progress down to the foot and ankle.  This is normal after surgery.  Elevate the leg when you are not up walking on it.   It is important for you to complete the blood thinner medication as prescribed by your doctor.  Continue to use the breathing machine which will help keep your temperature down.  It is common for your temperature to cycle up and down following surgery, especially at night when you are not up moving around and exerting yourself.  The breathing machine keeps your lungs expanded and your temperature down.  RANGE OF MOTION AND STRENGTHENING EXERCISES  Rehabilitation of the knee is important following a  knee injury or an operation. After just a few days of immobilization, the muscles of the thigh which control the knee become weakened and shrink (atrophy). Knee exercises are designed to build up the tone and strength of the thigh muscles and to improve knee motion. Often times heat used for twenty to thirty minutes before working out will loosen up your tissues and help with improving the range of motion but do not use heat for the first two weeks following  surgery. These exercises can be done on a training (exercise) mat, on the floor, on a table or on a bed. Use what ever works the best and is most comfortable for you Knee exercises include:  Leg Lifts - While your knee is still immobilized in a splint or cast, you can do straight leg raises. Lift the leg to 60 degrees, hold for 3 sec, and slowly lower the leg. Repeat 10-20 times 2-3 times daily. Perform this exercise against resistance later as your knee gets better.  Quad and Hamstring Sets - Tighten up the muscle on the front of the thigh (Quad) and hold for 5-10 sec. Repeat this 10-20 times hourly. Hamstring sets are done by pushing the foot backward against an object and holding for 5-10 sec. Repeat as with quad sets.  A rehabilitation program following serious knee injuries can speed recovery and prevent re-injury in the future due to weakened muscles. Contact your doctor or a physical therapist for more information on knee rehabilitation.   SKILLED REHAB INSTRUCTIONS: If the patient is transferred to a skilled rehab facility following release from the hospital, a list of the current medications will be sent to the facility for the patient to continue.  When discharged from the skilled rehab facility, please have the facility set up the patient's [place] prior to being released. Also, the skilled facility will be responsible for providing the patient with their medications at time of release from the facility to include their pain medication, the muscle relaxants, and their blood thinner medication. If the patient is still at the rehab facility at time of the two week follow up appointment, the skilled rehab facility will also need to assist the patient in arranging follow up appointment in our office and any transportation needs.  MAKE SURE YOU:  Understand these instructions.  Will watch your condition.  Will get help right away if you are not doing well or get worse.     Pick up stool softner and laxative for home use following surgery while on pain medications. Do NOT remove your dressing. You may shower.  Do not take tub baths or submerge incision under water. May shower starting three days after surgery. Please use a clean towel to pat the incision dry following showers. Continue to use ice for pain and swelling after surgery. Do not use any lotions or creams on the incision until instructed by your surgeon.

## 2018-01-02 NOTE — Anesthesia Postprocedure Evaluation (Signed)
Anesthesia Post Note  Patient: Teacher, English as a foreign language  Procedure(s) Performed: PATELLA-FEMORAL ARTHROPLASTY (Left Knee)     Patient location during evaluation: PACU Anesthesia Type: Spinal Level of consciousness: oriented, awake and alert and awake Pain management: pain level controlled Vital Signs Assessment: post-procedure vital signs reviewed and stable Respiratory status: spontaneous breathing, respiratory function stable, patient connected to nasal cannula oxygen and nonlabored ventilation Cardiovascular status: blood pressure returned to baseline and stable Postop Assessment: no headache, no backache, no apparent nausea or vomiting, spinal receding and patient able to bend at knees Anesthetic complications: no    Last Vitals:  Vitals:   01/02/18 1125 01/02/18 1229  BP: 115/73 114/64  Pulse: (!) 58 67  Resp: 16 18  Temp: (!) 36.4 C 36.9 C  SpO2: 100% 100%    Last Pain:  Vitals:   01/02/18 1229  TempSrc: Oral  PainSc:                  Catalina Gravel

## 2018-01-02 NOTE — Anesthesia Procedure Notes (Signed)
Procedure Name: MAC Date/Time: 01/02/2018 7:33 AM Performed by: Maxwell Caul, CRNA Pre-anesthesia Checklist: Patient identified, Emergency Drugs available and Patient being monitored Oxygen Delivery Method: Simple face mask

## 2018-01-02 NOTE — Anesthesia Procedure Notes (Signed)
Anesthesia Regional Block: Adductor canal block   Pre-Anesthetic Checklist: ,, timeout performed, Correct Patient, Correct Site, Correct Laterality, Correct Procedure, Correct Position, site marked, Risks and benefits discussed,  Surgical consent,  Pre-op evaluation,  At surgeon's request and post-op pain management  Laterality: Left and Lower  Prep: chloraprep       Needles:  Injection technique: Single-shot  Needle Type: Echogenic Needle     Needle Length: 9cm  Needle Gauge: 21     Additional Needles:   Procedures:,,,, ultrasound used (permanent image in chart),,,,  Narrative:  Start time: 01/02/2018 7:08 AM End time: 01/02/2018 7:13 AM Injection made incrementally with aspirations every 5 mL.  Performed by: Personally  Anesthesiologist: Catalina Gravel, MD  Additional Notes: No pain on injection. No increased resistance to injection. Injection made in 5cc increments.  Good needle visualization.  Patient tolerated procedure well.

## 2018-01-02 NOTE — Anesthesia Preprocedure Evaluation (Addendum)
Anesthesia Evaluation  Patient identified by MRN, date of birth, ID band Patient awake    Reviewed: Allergy & Precautions, NPO status , Patient's Chart, lab work & pertinent test results  Airway Mallampati: II  TM Distance: >3 FB Neck ROM: Full    Dental  (+) Teeth Intact, Dental Advisory Given   Pulmonary neg pulmonary ROS,    Pulmonary exam normal breath sounds clear to auscultation       Cardiovascular Exercise Tolerance: Good negative cardio ROS Normal cardiovascular exam Rhythm:Regular Rate:Normal     Neuro/Psych  Headaches,    GI/Hepatic Neg liver ROS, GERD  ,  Endo/Other  negative endocrine ROS  Renal/GU negative Renal ROS     Musculoskeletal  (+) Arthritis , Osteoarthritis,    Abdominal   Peds  Hematology  (+) Blood dyscrasia, anemia , Plt 237k   Anesthesia Other Findings Day of surgery medications reviewed with the patient.  Reproductive/Obstetrics negative OB ROS                            Anesthesia Physical Anesthesia Plan  ASA: II  Anesthesia Plan: Spinal   Post-op Pain Management:  Regional for Post-op pain   Induction:   PONV Risk Score and Plan: 2 and Propofol infusion, Treatment may vary due to age or medical condition and Midazolam  Airway Management Planned: Natural Airway and Nasal Cannula  Additional Equipment:   Intra-op Plan:   Post-operative Plan:   Informed Consent: I have reviewed the patients History and Physical, chart, labs and discussed the procedure including the risks, benefits and alternatives for the proposed anesthesia with the patient or authorized representative who has indicated his/her understanding and acceptance.   Dental advisory given  Plan Discussed with: CRNA, Anesthesiologist and Surgeon  Anesthesia Plan Comments:         Anesthesia Quick Evaluation

## 2018-01-02 NOTE — Interval H&P Note (Signed)
History and Physical Interval Note:  01/02/2018 7:29 AM  Holly Young  has presented today for surgery, with the diagnosis of Degenerative joint disease left knee  The various methods of treatment have been discussed with the patient and family. After consideration of risks, benefits and other options for treatment, the patient has consented to  Procedure(s): PATELLA-FEMORAL ARTHROPLASTY (Left) as a surgical intervention .  The patient's history has been reviewed, patient examined, no change in status, stable for surgery.  I have reviewed the patient's chart and labs.  Questions were answered to the patient's satisfaction.     Hilton Cork Doral Ventrella

## 2018-01-02 NOTE — Op Note (Signed)
OPERATIVE REPORT  SURGEON: Rod Can, MD   ASSISTANT: Nehemiah Massed, PA-C.  PREOPERATIVE DIAGNOSIS: Left knee patellofemoral arthritis.   POSTOPERATIVE DIAGNOSIS: Left knee patellofemoral arthritis.   PROCEDURE: Left knee patellofemoral arthroplasty.   IMPLANTS: Zimmer Patellofemoral trochlea, size 2 left. 3 button all poly patella, size 29 mm. Biomet bone cement.  ANESTHESIA:  MAC, Regional and Spinal  TOURNIQUET TIME: 60 min at 354m Hg.  ESTIMATED BLOOD LOSS:-100 mL    ANTIBIOTICS: 2 g Ancef.  DRAINS: None.  COMPLICATIONS: None   CONDITION: PACU - hemodynamically stable.   BRIEF CLINICAL NOTE: Holly Young a 48y.o. female with a long-standing history of Left knee patellofemoral arthritis. After failing conservative management, the patient was indicated for partial knee arthroplasty. The risks, benefits, and alternatives to the procedure were explained, and the patient elected to proceed.  PROCEDURE IN DETAIL: Regional anesthesia was obtained in the pre-op holding area. Once inside the operative room, spinal anesthesia was obtained, and a foley catheter was inserted. The patient was then positioned, a nonsterile tourniquet was placed, and the lower extremity was prepped and draped in the normal sterile surgical fashion. A time-out was called verifying side and site of surgery. The patient received IV antibiotics within 60 minutes of beginning the procedure. The extremity was exsanguinated with an Esmarch, and the tourniquet was inflated.  A limited anterior approach to the knee was performed utilizing a midvastus arthrotomy. Care was taken to preserve the menisci and articular cartilage.  Whiteside's line was marked with electrocautery. Drill was used to enter the medullary canal, and the contents were lavaged with saline and suctioned. The anterior cutting guide was positioned perpendicular to Whiteside's line and the depth of the resection was  set. The anterior cut was performed.   The trochlea was sized and the guide was pinned into place. The burr was used to mill the femur. The lug holes were drilled. The trial trochlea was inserted with excellent fit.  A freehand patellar resection was performed, and the patella was sized an prepared with 3 lug holes.  Trial patellar component was inserted. The patella had excellent tracking throughout the range of motion.   The cut bony surfaces were irrigated with pulse lavage. Final components were cemented into place and excess cement was cleared. The knee was brought into extension while the cement polymerized. Once the cement was hard, the knee was tested for a final time and found to be well balanced.  The wound was copiously irrigated with normal saline using pulse lavage. Marcaine solution was injected into the periarticular soft tissue. The wound was closed in layers using #1 Vicryl and Stratafix for the fascia, 2-0 Vicryl for the subcutaneous fat, 2-0 Monocryl for the deep dermal layer, 3-0 running Monocryl subcuticular Stitch, and Dermabond for the skin. Once the glue was fully dried, an Aquacell Ag and compressive dressing were applied. The tourniquet was let down, and the patient was transported to the recovery room in stable ondition. Sponge, needle, and instrument counts were correct at the end of the case x2. The patient tolerated the procedure well and there were no known complications.  Please note that a surgical assistant was a medical necessity for this procedure in order to perform it in a safe and expeditious manner. Surgical assistant was necessary to retract the ligaments and vital neurovascular structures to prevent injury to them and also necessary for proper positioning of the limb to allow for anatomic placement of the prosthesis.

## 2018-01-02 NOTE — Transfer of Care (Signed)
Immediate Anesthesia Transfer of Care Note  Patient: Holly Young  Procedure(s) Performed: PATELLA-FEMORAL ARTHROPLASTY (Left Knee)  Patient Location: PACU  Anesthesia Type:Spinal  Level of Consciousness: awake, alert  and oriented  Airway & Oxygen Therapy: Patient Spontanous Breathing and Patient connected to face mask oxygen  Post-op Assessment: Report given to RN and Post -op Vital signs reviewed and stable  Post vital signs: Reviewed and stable  Last Vitals:  Vitals Value Taken Time  BP 107/67 01/02/2018 10:15 AM  Temp    Pulse 63 01/02/2018 10:19 AM  Resp 22 01/02/2018 10:19 AM  SpO2 100 % 01/02/2018 10:19 AM  Vitals shown include unvalidated device data.  Last Pain:  Vitals:   01/02/18 0557  TempSrc:   PainSc: 1       Patients Stated Pain Goal: 4 (74/71/85 5015)  Complications: No apparent anesthesia complications

## 2018-01-02 NOTE — Plan of Care (Signed)
Plan of care 

## 2018-01-02 NOTE — Evaluation (Signed)
Physical Therapy Evaluation Patient Details Name: Holly Young MRN: [id number] DOB: October 30, 1970 Today's Date: 01/02/2018   History of Present Illness  48 yo female s/p L knee patellofemoral arthroplasty on 11/21/17. PMH includes R PFA s/p 6 weeks, GERD, migraines.   Clinical Impression  Pt admitted as above and presenting with decreased L LE strength/ROM and post op pain limiting functional mobility.  Pt should progress to dc home with assist of family/friends and reports HHPT is being arranged    Follow Up Recommendations Follow surgeon's recommendation for DC plan and follow-up therapies;Supervision for mobility/OOB    Equipment Recommendations  None recommended by PT    Recommendations for Other Services       Precautions / Restrictions Precautions Precautions: Fall Restrictions Weight Bearing Restrictions: No Other Position/Activity Restrictions: WBAT      Mobility  Bed Mobility Overal bed mobility: Needs Assistance Bed Mobility: Supine to Sit;Sit to Supine     Supine to sit: Min assist;HOB elevated Sit to supine: Min assist;HOB elevated   General bed mobility comments: cues for sequence with assist for L LE  Transfers Overall transfer level: Needs assistance Equipment used: Rolling walker (2 wheeled) Transfers: Sit to/from Stand Sit to Stand: Min assist;From elevated surface         General transfer comment: cues for LE management and use of UEs to self assist  Ambulation/Gait Ambulation/Gait assistance: Min assist;+2 safety/equipment Gait Distance (Feet): 30 Feet Assistive device: Rolling walker (2 wheeled) Gait Pattern/deviations: Step-to pattern;Decreased step length - right;Decreased step length - left;Shuffle;Trunk flexed Gait velocity: decreased    General Gait Details: cues for posture, position from RW and sequence; 2 epsidodes mild buckling at L knee  Stairs            Wheelchair Mobility    Modified Rankin (Stroke Patients Only)        Balance Overall balance assessment: Mild deficits observed, not formally tested                                           Pertinent Vitals/Pain Pain Assessment: 0-10 Pain Score: 2  Pain Location: L knee Pain Descriptors / Indicators: Sore Pain Intervention(s): Limited activity within patient's tolerance;Monitored during session;Premedicated before session;Ice applied    Home Living Family/patient expects to be discharged to:: Private residence Living Arrangements: Children Available Help at Discharge: Family;Available PRN/intermittently;Neighbor Type of Home: House Home Access: Stairs to enter Entrance Stairs-Rails: None Entrance Stairs-Number of Steps: 3 Home Layout: Two level;Able to live on main level with bedroom/bathroom Home Equipment: Gilford Rile - 2 wheels;Cane - single point;Bedside commode      Prior Function Level of Independence: Independent               Hand Dominance   Dominant Hand: Right    Extremity/Trunk Assessment   Upper Extremity Assessment Upper Extremity Assessment: Overall WFL for tasks assessed    Lower Extremity Assessment Lower Extremity Assessment: LLE deficits/detail       Communication   Communication: No difficulties  Cognition Arousal/Alertness: Awake/alert Behavior During Therapy: WFL for tasks assessed/performed Overall Cognitive Status: Within Functional Limits for tasks assessed                                        General Comments      Exercises  Total Joint Exercises Ankle Circles/Pumps: AROM;Both;15 reps;Supine   Assessment/Plan    PT Assessment Patient needs continued PT services  PT Problem List Decreased strength;Pain;Decreased range of motion;Decreased activity tolerance;Decreased knowledge of use of DME;Decreased balance;Decreased safety awareness;Decreased mobility       PT Treatment Interventions Therapeutic activities;DME instruction;Gait training;Therapeutic  exercise;Patient/family education;Stair training;Balance training;Functional mobility training    PT Goals (Current goals can be found in the Care Plan section)  Acute Rehab PT Goals Patient Stated Goal: Regain IND PT Goal Formulation: With patient Time For Goal Achievement: 11/28/17 Potential to Achieve Goals: Good    Frequency 7X/week   Barriers to discharge        Co-evaluation               AM-PAC PT "6 Clicks" Mobility  Outcome Measure Help needed turning from your back to your side while in a flat bed without using bedrails?: A Little Help needed moving from lying on your back to sitting on the side of a flat bed without using bedrails?: A Little Help needed moving to and from a bed to a chair (including a wheelchair)?: A Little Help needed standing up from a chair using your arms (e.g., wheelchair or bedside chair)?: A Little Help needed to walk in hospital room?: A Little Help needed climbing 3-5 steps with a railing? : A Lot 6 Click Score: 17    End of Session Equipment Utilized During Treatment: Gait belt Activity Tolerance: Patient tolerated treatment well Patient left: with call bell/phone within reach;in bed;with family/visitor present Nurse Communication: Mobility status PT Visit Diagnosis: Other abnormalities of gait and mobility (R26.89);Difficulty in walking, not elsewhere classified (R26.2)    Time: 1740-8144 PT Time Calculation (min) (ACUTE ONLY): 36 min   Charges:     PT Treatments $Gait Training: 8-22 mins        [place] Pager [phone] Office [phone]   Armstead Heiland 01/02/2018, 5:10 PM

## 2018-01-03 LAB — CBC
HEMATOCRIT: 33.7 % — AB (ref 36.0–46.0)
Hemoglobin: 10.2 g/dL — ABNORMAL LOW (ref 12.0–15.0)
MCH: 28.3 pg (ref 26.0–34.0)
MCHC: 30.3 g/dL (ref 30.0–36.0)
MCV: 93.6 fL (ref 80.0–100.0)
Platelets: 183 10*3/uL (ref 150–400)
RBC: 3.6 MIL/uL — ABNORMAL LOW (ref 3.87–5.11)
RDW: 14.6 % (ref 11.5–15.5)
WBC: 6.5 10*3/uL (ref 4.0–10.5)
nRBC: 0 % (ref 0.0–0.2)

## 2018-01-03 LAB — BASIC METABOLIC PANEL
Anion gap: 8 (ref 5–15)
BUN: 11 mg/dL (ref 6–20)
CHLORIDE: 105 mmol/L (ref 98–111)
CO2: 24 mmol/L (ref 22–32)
Calcium: 8.8 mg/dL — ABNORMAL LOW (ref 8.9–10.3)
Creatinine, Ser: 0.61 mg/dL (ref 0.44–1.00)
GFR calc non Af Amer: >60 mL/min (ref >60)
Glucose, Bld: 92 mg/dL (ref 70–99)
Potassium: 4.8 mmol/L (ref 3.5–5.1)
Sodium: 137 mmol/L (ref 135–145)

## 2018-01-03 NOTE — Progress Notes (Signed)
Physical Therapy Treatment Patient Details Name: Holly Young MRN: [id number] DOB: 08-17-1970 Today's Date: 01/03/2018    History of Present Illness 48 yo female s/p L knee patellofemoral arthroplasty on 11/21/17. PMH includes R PFA s/p 6 weeks, GERD, migraines.     PT Comments    Pt  Progressing well and eager for dc home.  Reviewed stairs and home therex with written instruction provided.   Follow Up Recommendations  Follow surgeon's recommendation for DC plan and follow-up therapies;Supervision for mobility/OOB     Equipment Recommendations  None recommended by PT    Recommendations for Other Services       Precautions / Restrictions Precautions Precautions: Fall Restrictions Weight Bearing Restrictions: No Other Position/Activity Restrictions: WBAT    Mobility  Bed Mobility Overal bed mobility: Needs Assistance Bed Mobility: Supine to Sit;Sit to Supine     Supine to sit: Supervision Sit to supine: Supervision   General bed mobility comments: Pt self assisting L LE with R LE  Transfers Overall transfer level: Needs assistance Equipment used: Rolling walker (2 wheeled) Transfers: Sit to/from Stand Sit to Stand: Supervision         General transfer comment: cues for LE management and use of UEs to self assist  Ambulation/Gait Ambulation/Gait assistance: Min guard;Supervision Gait Distance (Feet): 75 Feet Assistive device: Rolling walker (2 wheeled) Gait Pattern/deviations: Step-to pattern;Decreased step length - right;Decreased step length - left;Shuffle;Trunk flexed Gait velocity: decreased    General Gait Details: cues for posture, position from RW and sequence; no buckling noted L knee   Stairs Stairs: Yes Stairs assistance: Min assist Stair Management: No rails;Step to pattern;Forwards;With walker;Backwards;With cane Number of Stairs: 6 General stair comments: 2 stairs fwd with cane and HHA; 4 stairs bkwd with RW; cues for seqeunce and  foot/cane/RW placement   Wheelchair Mobility    Modified Rankin (Stroke Patients Only)       Balance Overall balance assessment: Mild deficits observed, not formally tested                                          Cognition Arousal/Alertness: Awake/alert Behavior During Therapy: WFL for tasks assessed/performed Overall Cognitive Status: Within Functional Limits for tasks assessed                                        Exercises      General Comments        Pertinent Vitals/Pain Pain Assessment: 0-10 Pain Score: 3  Pain Location: L knee Pain Descriptors / Indicators: Sore Pain Intervention(s): Limited activity within patient's tolerance;Monitored during session;Premedicated before session;Ice applied    Home Living                      Prior Function            PT Goals (current goals can now be found in the care plan section) Acute Rehab PT Goals Patient Stated Goal: Regain IND PT Goal Formulation: With patient Time For Goal Achievement: 11/28/17 Potential to Achieve Goals: Good Progress towards PT goals: Progressing toward goals    Frequency    7X/week      PT Plan Current plan remains appropriate    Co-evaluation              AM-PAC  PT "6 Clicks" Mobility   Outcome Measure  Help needed turning from your back to your side while in a flat bed without using bedrails?: None Help needed moving from lying on your back to sitting on the side of a flat bed without using bedrails?: None Help needed moving to and from a bed to a chair (including a wheelchair)?: None Help needed standing up from a chair using your arms (e.g., wheelchair or bedside chair)?: None Help needed to walk in hospital room?: A Little Help needed climbing 3-5 steps with a railing? : A Little 6 Click Score: 22    End of Session Equipment Utilized During Treatment: Gait belt Activity Tolerance: Patient tolerated treatment  well Patient left: with call bell/phone within reach;in bed Nurse Communication: Mobility status PT Visit Diagnosis: Other abnormalities of gait and mobility (R26.89);Difficulty in walking, not elsewhere classified (R26.2)     Time: 8381-8403 PT Time Calculation (min) (ACUTE ONLY): 35 min  Charges:  $Gait Training: 8-22 mins $Therapeutic Activity: 8-22 mins                     Debe Coder PT Acute Rehabilitation Services Pager [phone] Office [phone]    Bruno Leach 01/03/2018, 2:41 PM

## 2018-01-03 NOTE — Progress Notes (Signed)
Patient discharged to home w/ friend. Given all belongings, instructions. Verbalized understanding of instructions. Escorted to pov via w/c. 

## 2018-01-03 NOTE — Care Management Note (Signed)
Case Management Note  Patient Details  Name: Holly Young MRN: [id number] Date of Birth: 10-Aug-1970  Subjective/Objective:     Discharge planning, spoke with patient at bedside. Have chosen [facility] for [facility] PT, evaluate and treat.   Action/Plan: Contacted [facility] for referral. Has RW and 3n1. [phone]               Expected Discharge Date:  01/03/18               Expected Discharge Plan:  [place]  In-House Referral:  NA  Discharge planning Services  CM Consult  Post Acute Care Choice:  Home Health Choice offered to:  Patient  DME Arranged:  N/A DME Agency:  NA  HH Arranged:  [place] Agency:  [facility] (formerly [employer])  Status of Service:  Completed, signed off  If discussed at [employer], dates discussed:    Additional Comments:  Guadalupe Maple, RN 01/03/2018, 2:34 PM

## 2018-01-03 NOTE — Discharge Summary (Signed)
Physician Discharge Summary  Patient ID: Holly Young MRN: [id number] DOB/AGE: August 24, 1970 48 y.o.  Admit date: 01/02/2018 Discharge date: 01/03/2018  Admission Diagnoses:  Patellofemoral arthritis of left knee  Discharge Diagnoses:  Principal Problem:   Patellofemoral arthritis of left knee   Past Medical History:  Diagnosis Date  . Arthritis   . Family history of adverse reaction to anesthesia    father's aunt had a stroke after surgery -back in early 1990's  . Family history of breast cancer   . Family history of colon cancer   . GERD (gastroesophageal reflux disease)   . Headache    Migraines  . Pneumonia     Surgeries: Procedure(s): PATELLA-FEMORAL ARTHROPLASTY on 01/02/2018   Consultants (if any):   Discharged Condition: Improved  Hospital Course: Holly Young is an 48 y.o. female who was admitted 01/02/2018 with a diagnosis of Patellofemoral arthritis of left knee and went to the operating room on 01/02/2018 and underwent the above named procedures.    She was given perioperative antibiotics:  Anti-infectives (From admission, onward)   Start     Dose/Rate Route Frequency Ordered Stop   01/02/18 1400  ceFAZolin (ANCEF) IVPB 2g/100 mL premix     2 g 200 mL/hr over 30 Minutes Intravenous Every 6 hours 01/02/18 1126 01/02/18 2007   01/02/18 0615  ceFAZolin (ANCEF) IVPB 2g/100 mL premix     2 g 200 mL/hr over 30 Minutes Intravenous On call to O.R. 01/02/18 1062 01/02/18 0804    .  She was given sequential compression devices, early ambulation, and ASA for DVT prophylaxis.  She benefited maximally from the hospital stay and there were no complications.    Recent vital signs:  Vitals:   01/03/18 0201 01/03/18 0535  BP: 125/78 139/77  Pulse: 60 (!) 58  Resp: 15 14  Temp: 98.3 F (36.8 C) 98.2 F (36.8 C)  SpO2: 100% 100%    Recent laboratory studies:  Lab Results  Component Value Date   HGB 10.2 (L) 01/03/2018   HGB 11.7 (L) 12/26/2017   HGB 10.2  (L) 11/22/2017   Lab Results  Component Value Date   WBC 6.5 01/03/2018   PLT 183 01/03/2018   No results found for: INR Lab Results  Component Value Date   NA 137 01/03/2018   K 4.8 01/03/2018   CL 105 01/03/2018   CO2 24 01/03/2018   BUN 11 01/03/2018   CREATININE 0.61 01/03/2018   GLUCOSE 92 01/03/2018    Discharge Medications:   Allergies as of 01/03/2018      Reactions   2-octylcyanoacrylate [cyanoacrylate] Other (See Comments)   Derma bond-caused redness at incision site, severe blisters with pustules, severe itching and burning of skin   Adhesive [tape] Other (See Comments)   Blisters   Betadine [povidone Iodine] Other (See Comments)   Eats her skin away   Chamomile Other (See Comments)   Tea-throat itches   Latex Rash, Other (See Comments)   Latex bandages cause irritation.       Medication List    STOP taking these medications   diclofenac sodium 1 % Gel Commonly known as:  VOLTAREN     TAKE these medications   aspirin 81 MG chewable tablet Chew 1 tablet (81 mg total) by mouth 2 (two) times daily.   benzonatate 100 MG capsule Commonly known as:  TESSALON Take 100 mg by mouth daily as needed for cough.   diphenhydrAMINE 25 MG tablet Commonly known as:  BENADRYL Take 25  mg by mouth at bedtime.   docusate sodium 100 MG capsule Commonly known as:  COLACE Take 1 capsule (100 mg total) by mouth 2 (two) times daily.   HYDROcodone-acetaminophen 5-325 MG tablet Commonly known as:  NORCO/VICODIN Take 1-2 tablets by mouth every 6 (six) hours as needed for moderate pain.   loratadine 10 MG tablet Commonly known as:  CLARITIN Take 10 mg by mouth daily.   Magnesium Oxide 420 MG Tabs Take 840 mg by mouth daily.   Melatonin 3 MG Tabs Take 3 mg by mouth at bedtime as needed (for sleep).   ondansetron 4 MG tablet Commonly known as:  ZOFRAN Take 1 tablet (4 mg total) by mouth every 6 (six) hours as needed for nausea.   OVER THE COUNTER MEDICATION Place  3-4 tablets under the tongue daily as needed (for restless legs). Hyland's Restful Legs   polyvinyl alcohol 1.4 % ophthalmic solution Commonly known as:  LIQUIFILM TEARS Place 2 drops into both eyes every morning.   pseudoephedrine 120 MG 12 hr tablet Commonly known as:  SUDAFED Take 120 mg by mouth daily.   senna 8.6 MG Tabs tablet Commonly known as:  SENOKOT Take 1 tablet (8.6 mg total) by mouth 2 (two) times daily.   vitamin B-12 500 MCG tablet Commonly known as:  CYANOCOBALAMIN Take 1,000 mcg by mouth daily.   Vitamin D3 50 MCG (2000 UT) Tabs Take 2,000 Units by mouth daily.       Diagnostic Studies: Dg Knee Left Port  Result Date: 01/02/2018 CLINICAL DATA:  Postop partial left knee replacement. EXAM: PORTABLE LEFT KNEE - 1-2 VIEW COMPARISON:  None. FINDINGS: AP and lateral views obtained portably. Patient is status post patellofemoral arthroplasty. The hardware appears well positioned. There is no evidence of acute fracture or dislocation. There is gas within the joint and surrounding soft tissues. IMPRESSION: No demonstrated complication following left patellofemoral arthroplasty. Electronically Signed   By: Richardean Sale M.D.   On: 01/02/2018 10:50    Disposition: Discharge disposition: 01-Home or Self Care       Discharge Instructions    Call MD / Call 911   Complete by:  As directed    If you experience chest pain or shortness of breath, CALL 911 and be transported to the hospital emergency room.  If you develope a fever above 101 F, pus (white drainage) or increased drainage or redness at the wound, or calf pain, call your surgeon's office.   Constipation Prevention   Complete by:  As directed    Drink plenty of fluids.  Prune juice may be helpful.  You may use a stool softener, such as Colace (over the counter) 100 mg twice a day.  Use MiraLax (over the counter) for constipation as needed.   Diet - low sodium heart healthy   Complete by:  As directed    Do not  put a pillow under the knee. Place it under the heel.   Complete by:  As directed    Driving restrictions   Complete by:  As directed    No driving for 4 weeks   Increase activity slowly as tolerated   Complete by:  As directed    Lifting restrictions   Complete by:  As directed    No lifting for 6 weeks   TED hose   Complete by:  As directed    Use stockings (TED hose) for 2 weeks on both leg(s).  You may remove them at night for  sleeping.      Follow-up Information    Etheleen Valtierra, Aaron Edelman, MD. Schedule an appointment as soon as possible for a visit in 2 weeks.   Specialty:  Orthopedic Surgery Why:  For wound re-check Contact information: [address] [place] [address] [phone]            Signed: Hilton Cork Lear Carstens 01/03/2018, 7:50 AM

## 2018-01-03 NOTE — Progress Notes (Signed)
Physical Therapy Treatment Patient Details Name: Holly Young MRN: [id number] DOB: [birth year] Today's Date: 01/03/2018    History of Present Illness 48 yo female s/p L knee patellofemoral arthroplasty on 11/21/17. PMH includes R PFA s/p 6 weeks, GERD, migraines.     PT Comments    Pt progressing well with mobility and hopeful for dc home this date.  Pt nauseous this session - RN aware.   Follow Up Recommendations  Follow surgeon's recommendation for DC plan and follow-up therapies;Supervision for mobility/OOB     Equipment Recommendations  None recommended by PT    Recommendations for Other Services       Precautions / Restrictions Precautions Precautions: Fall Restrictions Weight Bearing Restrictions: No Other Position/Activity Restrictions: WBAT    Mobility  Bed Mobility Overal bed mobility: Needs Assistance Bed Mobility: Supine to Sit;Sit to Supine     Supine to sit: Supervision Sit to supine: Min guard   General bed mobility comments: Pt self assisting L LE with R LE  Transfers Overall transfer level: Needs assistance Equipment used: Rolling walker (2 wheeled) Transfers: Sit to/from Stand Sit to Stand: Min assist         General transfer comment: cues for LE management and use of UEs to self assist  Ambulation/Gait Ambulation/Gait assistance: Min assist;Min guard Gait Distance (Feet): 111 Feet Assistive device: Rolling walker (2 wheeled) Gait Pattern/deviations: Step-to pattern;Decreased step length - right;Decreased step length - left;Shuffle;Trunk flexed Gait velocity: decreased    General Gait Details: cues for posture, position from RW and sequence; no buckling noted L knee   Stairs             Wheelchair Mobility    Modified Rankin (Stroke Patients Only)       Balance Overall balance assessment: Mild deficits observed, not formally tested                                          Cognition  Arousal/Alertness: Awake/alert Behavior During Therapy: WFL for tasks assessed/performed Overall Cognitive Status: Within Functional Limits for tasks assessed                                        Exercises Total Joint Exercises Ankle Circles/Pumps: AROM;Both;15 reps;Supine Quad Sets: AROM;Both;10 reps;Supine Heel Slides: AAROM;Left;15 reps;Supine Straight Leg Raises: AAROM;AROM;Left;15 reps;Supine Long Arc Quad: AROM;Left;10 reps;Supine Goniometric ROM: AAROM -8 - 95    General Comments        Pertinent Vitals/Pain Pain Assessment: 0-10 Pain Score: 3  Pain Location: L knee Pain Descriptors / Indicators: Sore Pain Intervention(s): Limited activity within patient's tolerance;Monitored during session;Premedicated before session;Ice applied    Home Living                      Prior Function            PT Goals (current goals can now be found in the care plan section) Acute Rehab PT Goals Patient Stated Goal: Regain IND PT Goal Formulation: With patient Time For Goal Achievement: 11/28/17 Potential to Achieve Goals: Good Progress towards PT goals: Progressing toward goals    Frequency    7X/week      PT Plan Current plan remains appropriate    Co-evaluation  AM-PAC PT "6 Clicks" Mobility   Outcome Measure  Help needed turning from your back to your side while in a flat bed without using bedrails?: A Little Help needed moving from lying on your back to sitting on the side of a flat bed without using bedrails?: A Little Help needed moving to and from a bed to a chair (including a wheelchair)?: A Little Help needed standing up from a chair using your arms (e.g., wheelchair or bedside chair)?: A Little Help needed to walk in hospital room?: A Little Help needed climbing 3-5 steps with a railing? : A Lot 6 Click Score: 17    End of Session Equipment Utilized During Treatment: Gait belt Activity Tolerance: Patient  tolerated treatment well Patient left: with call bell/phone within reach;in bed Nurse Communication: Mobility status PT Visit Diagnosis: Other abnormalities of gait and mobility (R26.89);Difficulty in walking, not elsewhere classified (R26.2)     Time: 8811-0315 PT Time Calculation (min) (ACUTE ONLY): 46 min  Charges:  $Gait Training: 23-37 mins $Therapeutic Exercise: 8-22 mins                     [place] Pager [phone] Office [phone]    Khila Papp 01/03/2018, 10:33 AM

## 2018-01-03 NOTE — Progress Notes (Addendum)
    Subjective:  Patient reports pain as mild.  Denies N/V/CP/SOB. No c/o.  Objective:   VITALS:   Vitals:   01/02/18 1838 01/02/18 2146 01/03/18 0201 01/03/18 0535  BP: 119/72 121/70 125/78 139/77  Pulse: 60 63 60 (!) 58  Resp: 16 16 15 14   Temp: 97.7 F (36.5 C) 98.3 F (36.8 C) 98.3 F (36.8 C) 98.2 F (36.8 C)  TempSrc: Oral  Oral Oral  SpO2: 100% 100% 100% 100%  Weight:      Height:        NAD ABD soft Sensation intact distally Intact pulses distally Dorsiflexion/Plantar flexion intact Incision: dressing C/D/I Compartment soft   Lab Results  Component Value Date   WBC 6.5 01/03/2018   HGB 10.2 (L) 01/03/2018   HCT 33.7 (L) 01/03/2018   MCV 93.6 01/03/2018   PLT 183 01/03/2018   BMET    Component Value Date/Time   NA 137 01/03/2018 0542   K 4.8 01/03/2018 0542   CL 105 01/03/2018 0542   CO2 24 01/03/2018 0542   GLUCOSE 92 01/03/2018 0542   BUN 11 01/03/2018 0542   CREATININE 0.61 01/03/2018 0542   CALCIUM 8.8 (L) 01/03/2018 0542   GFRNONAA >60 01/03/2018 0542   GFRAA >60 01/03/2018 0542     Assessment/Plan: 1 Day Post-Op   Principal Problem:   Patellofemoral arthritis of left knee   WBAT with walker DVT ppx: Aspirin, SCDs, TEDS PO pain control PT/OT Dispo: D/C home with HHPT, already has Rx at home   Bertram Savin 01/03/2018, 7:47 AM   Rod Can, MD Cell: [phone] Holly Young is now [facility] [address], [place], [place], [place] [address] Phone: [phone] [email or web address]  [employer]

## 2018-01-06 ENCOUNTER — Encounter (HOSPITAL_COMMUNITY): Payer: Self-pay | Admitting: Orthopedic Surgery

## 2018-01-07 ENCOUNTER — Ambulatory Visit: Payer: Self-pay | Admitting: Genetic Counselor

## 2018-01-07 DIAGNOSIS — Z1379 Encounter for other screening for genetic and chromosomal anomalies: Secondary | ICD-10-CM

## 2018-01-07 NOTE — Progress Notes (Signed)
HPI:  [name] was previously seen in the [place] clinic due to a family history of cancer and concerns regarding a hereditary predisposition to cancer. Please refer to our prior cancer genetics clinic note for more information regarding [name]'s medical, social and family histories, and our assessment and recommendations, at the time. [name] recent genetic test results were disclosed to her, as were recommendations warranted by these results. These results and recommendations are discussed in more detail below.  CANCER HISTORY:   No history exists.    FAMILY HISTORY:  We obtained a detailed, 4-generation family history.  Significant diagnoses are listed below: Family History  Problem Relation Age of Onset  . Breast cancer Mother 97       dbl mastectomy  . Heart attack Maternal Uncle   . Autoimmune disease Maternal Grandmother   . Cancer Maternal Grandfather        salivary glad cancer, d. 57  . Colon cancer Paternal Grandfather   . Breast cancer Other        PGMs 3 sisters    The patient has two daughters, one who had NHL at age 67.  She is currently 25.  She has one brother who is cancer free. Both parents are living.  The patient's mother was diagnosed with breast cancer at 26 and had a double mastectomy.  She had five brothers and one sister who cancer free.  The grandparents are deceased.  The grandmother died of an autoimmune disease and the grandfather died of salivary gland cancer.  The patient's father is an only child.  His father died of colon cancer and bone marrow cancer in his 51's.  The grandmother died early from an accident.  She had 3 sisters with breast cancer.  [name] is aware of previous family history of genetic testing for hereditary cancer risks. Patient's maternal ancestors are of [place] descent, and paternal ancestors are of [place] and [place] descent. There is no reported Ashkenazi  Jewish ancestry. There is no known consanguinity.  GENETIC TEST RESULTS: Genetic testing reported out on December 30, 2017 through the multi-cancer panel found no deleterious mutations. The Multi-Gene Panel offered by [facility] includes sequencing and/or deletion duplication testing of the following 84 genes: AIP, ALK, APC, ATM, AXIN2,BAP1,  BARD1, BLM, BMPR1A, BRCA1, BRCA2, BRIP1, CASR, CDC73, CDH1, CDK4, CDKN1B, CDKN1C, CDKN2A (p14ARF), CDKN2A (p16INK4a), CEBPA, CHEK2, CTNNA1, DICER1, DIS3L2, EGFR (c.2369C>T, p.Thr790Met variant only), EPCAM (Deletion/duplication testing only), FH, FLCN, GATA2, GPC3, GREM1 (Promoter region deletion/duplication testing only), HOXB13 (c.251G>A, p.Gly84Glu), HRAS, KIT, MAX, MEN1, MET, MITF (c.952G>A, p.Glu318Lys variant only), MLH1, MSH2, MSH3, MSH6, MUTYH, NBN, NF1, NF2, NTHL1, PALB2, PDGFRA, PHOX2B, PMS2, POLD1, POLE, POT1, PRKAR1A, PTCH1, PTEN, RAD50, RAD51C, RAD51D, RB1, RECQL4, RET, RUNX1, SDHAF2, SDHA (sequence changes only), SDHB, SDHC, SDHD, SMAD4, SMARCA4, SMARCB1, SMARCE1, STK11, SUFU, TERC, TERT, TMEM127, TP53, TSC1, TSC2, VHL, WRN and WT1.  The test report has been scanned into EPIC and is located under the Molecular Pathology section of the Results Review tab.    We discussed with [name] that since the current genetic testing is not perfect, it is possible there may be a gene mutation in one of these genes that current testing cannot detect, but that chance is small.  We also discussed, that it is possible that another gene that has not yet been discovered, or that we have not yet tested, is responsible for the cancer diagnoses in the family, and it is, therefore, important  to remain in touch with cancer genetics in the future so that we can continue to offer [name] the most up to date genetic testing.    CANCER SCREENING RECOMMENDATIONS:  This normal result is reassuring and indicates that [name] does not likely have an increased risk of  cancer due to a mutation in one of these genes.  We, therefore, recommended  [name] continue to follow the cancer screening guidelines provided by her primary healthcare providers.   An individual's cancer risk and medical management are not determined by genetic test results alone. Overall cancer risk assessment incorporates additional factors, including personal medical history, family history, and any available genetic information that may result in a personalized plan for cancer prevention and surveillance.  Based on [name]'s family history of cancer, as well as her genetic test results, statistical models (Penn II, [employer] and [employer]) and literature data were used to estimate her risk of developing breast cancer. These estimate her lifetime risk of developing breast cancer to be approximately 6% to 21.1%.  The patient's lifetime breast cancer risk is a preliminary estimate based on available information using one of several models endorsed by the [place] (ACS). The ACS recommends consideration of breast MRI screening as an adjunct to mammography for patients at high risk (defined as 20% or greater lifetime risk). A more detailed breast cancer risk assessment can be considered, if clinically indicated.      [name] has been determined by one model to be at high risk for breast cancer. Two other models have not found her to be at high risk.  We discussed that [name] should discuss her individual situation with her referring physician and determine a breast cancer screening plan with which they are both comfortable.    RECOMMENDATIONS FOR FAMILY MEMBERS:  Women in this family might be at some increased risk of developing cancer, over the general population risk, simply due to the family history of cancer.  We recommended women in this family have a yearly mammogram beginning at age 2, or 50 years younger than the earliest onset of cancer, an  annual clinical breast exam, and perform monthly breast self-exams. Women in this family should also have a gynecological exam as recommended by their primary provider. All family members should have a colonoscopy by age 35.  FOLLOW-UP: Lastly, we discussed with [name] that cancer genetics is a rapidly advancing field and it is possible that new genetic tests will be appropriate for her and/or her family members in the future. We encouraged her to remain in contact with cancer genetics on an annual basis so we can update her personal and family histories and let her know of advances in cancer genetics that may benefit this family.   Our contact number was provided. [name]'s questions were answered to her satisfaction, and she knows she is welcome to call us at anytime with additional questions or concerns.   Roma Kayser, MS, [facility] Certified Genetic Counselor [email or web address]

## 2019-01-03 IMAGING — MR MR BILATERAL BREAST WITHOUT AND WITH CONTRAST
6 of 13 series · 19 of 48 positions shown · IV contrast (Yes)
Comparison: Prior outside mammograms and ultrasounds dated
08/19/2017 and prior studies

CLINICAL DATA: 47-year-old female for screening breast MRI. High
lifetime risk for developing breast cancer and strong family history
of breast cancer.

LABS:  None performed today
EXAM:
BILATERAL BREAST MRI WITH AND WITHOUT CONTRAST
TECHNIQUE: Multiplanar, multisequence MR images of both breasts were obtained
prior to and following the intravenous administration of 8 ml of
Gadavist

[Series 1: (phone_number) · axial · 7.0mm · 1.56mm/px · 1 of 25 slices shown]
[im 1/25]
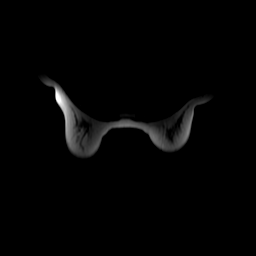

[Series 4: ax ir · axial · 3.0mm · 0.70mm/px · 1 of 83 slices shown]
[im 1/83]
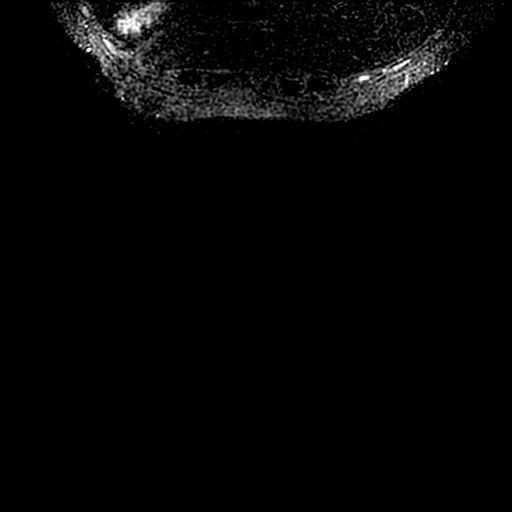

[Series 600: vibrant mph +c · axial · 2.0mm · 0.64mm/px · z∈[-146,+101]mm · 5 of 248 slices shown]
[im 1/248]
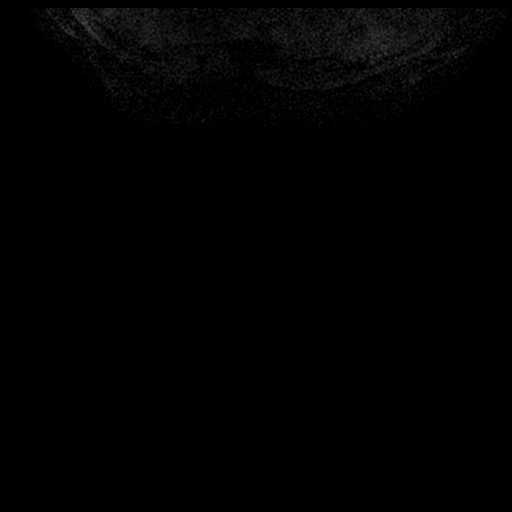
[im 62/248]
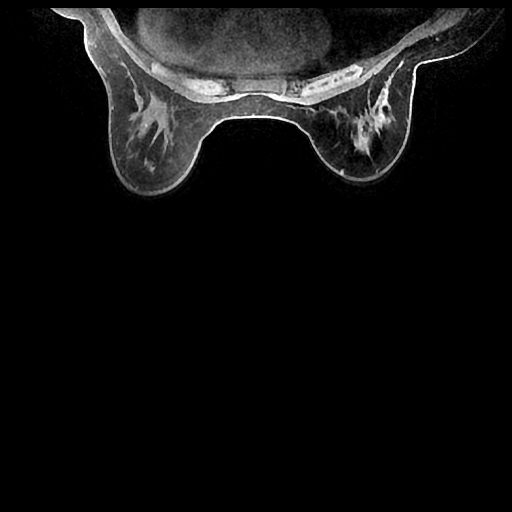
[im 124/248]
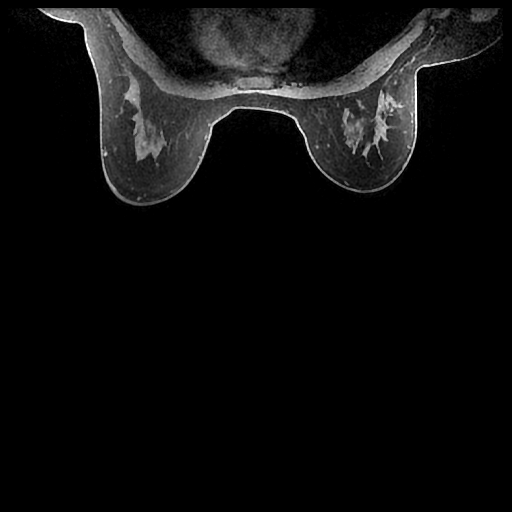
[im 186/248]
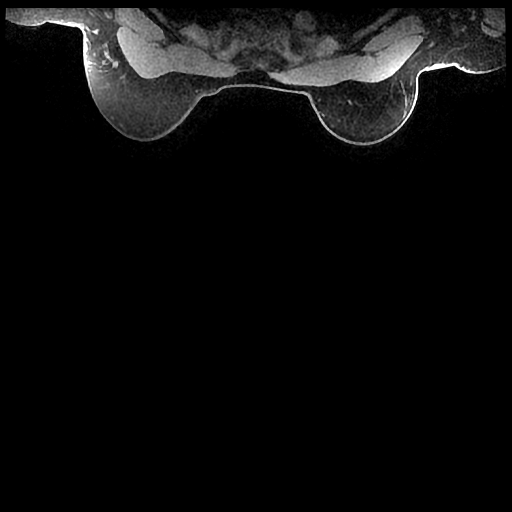
[im 248/248]
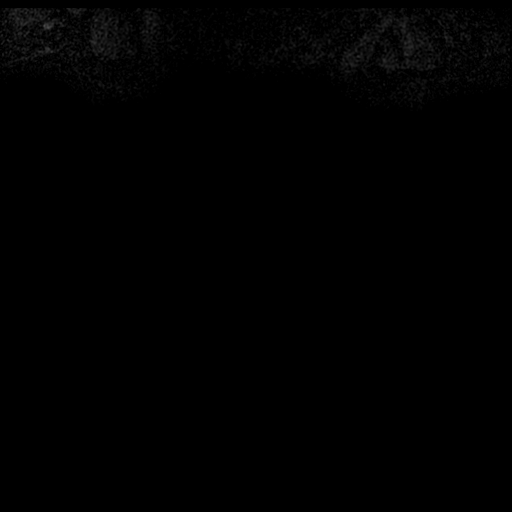

[Series 601: ph1/ vibrant mph · axial · 2.0mm · 0.64mm/px · z∈[-146,+101]mm · 5 of 248 slices shown]
[im 1/248]
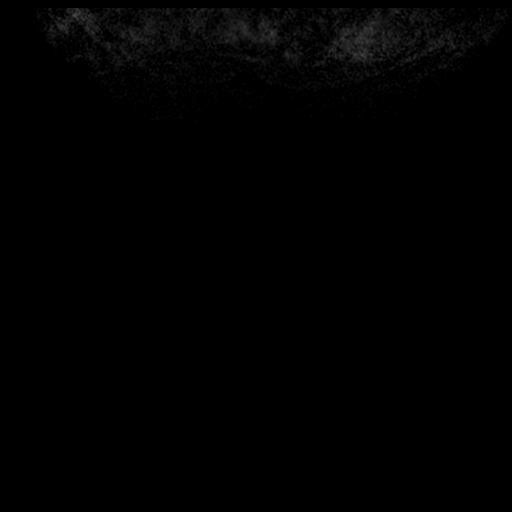
[im 62/248]
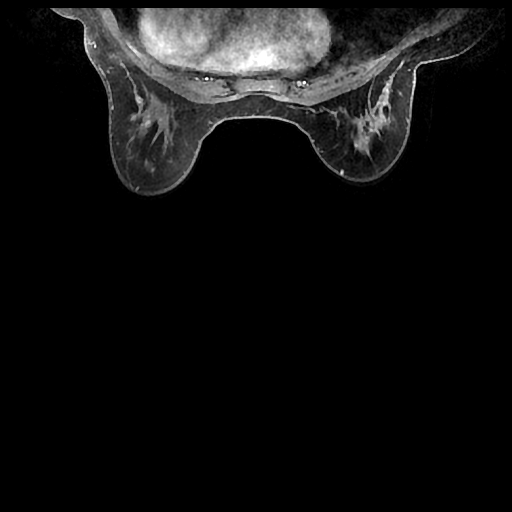
[im 124/248]
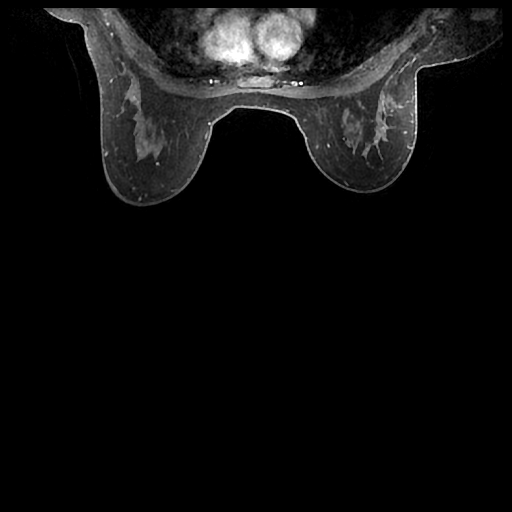
[im 186/248]
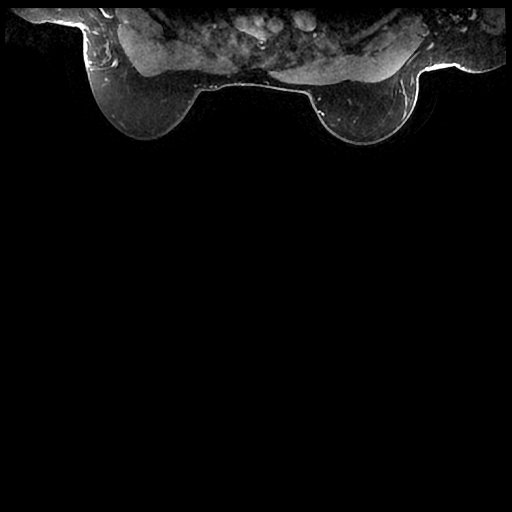
[im 248/248]
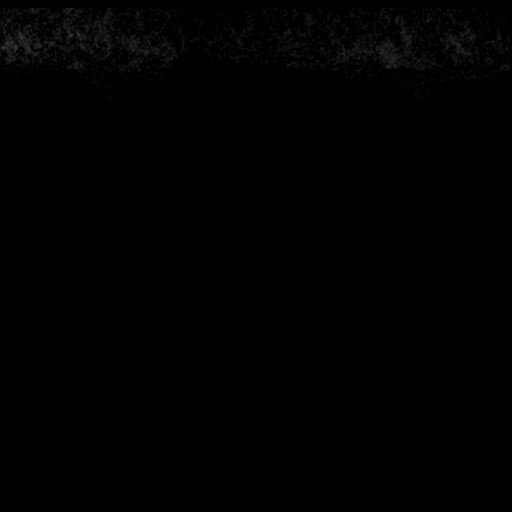

[Series 602: ph2/ vibrant mph · axial · 2.0mm · 0.64mm/px · z∈[-146,+101]mm · 5 of 248 slices shown]
[im 1/248]
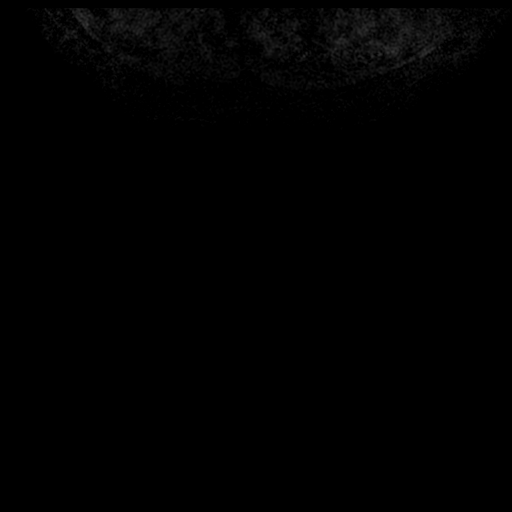
[im 62/248]
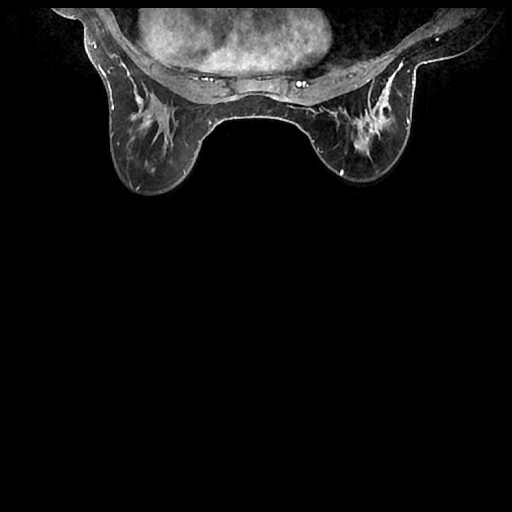
[im 124/248]
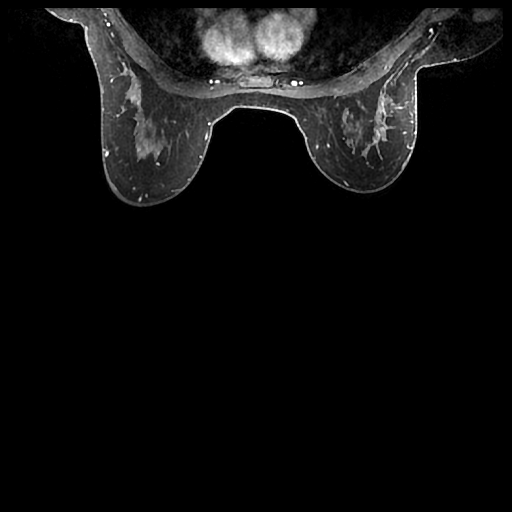
[im 186/248]
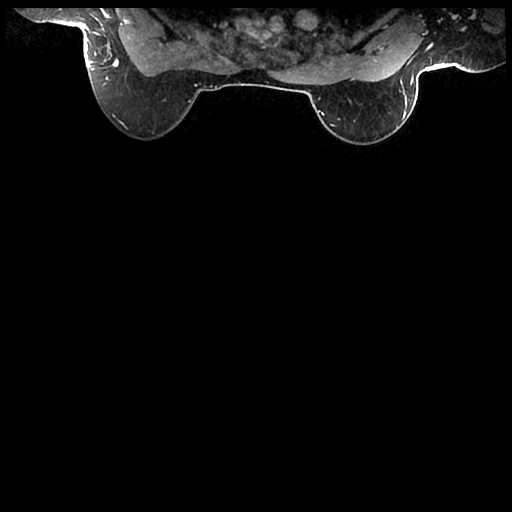
[im 248/248]
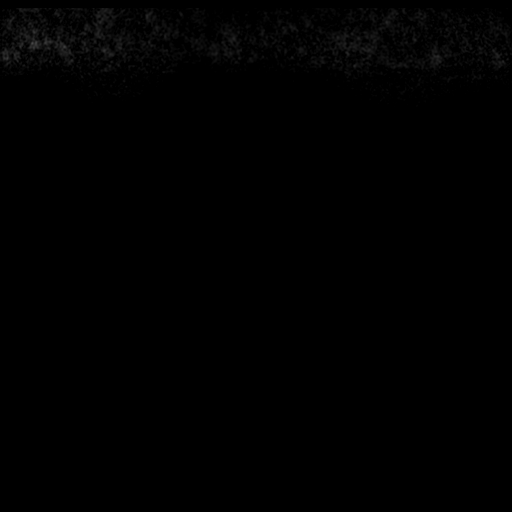

[Series 603: ph3/ vibrant mph · axial · 2.0mm · 0.64mm/px · z∈[-146,-97]mm · 2 of 248 slices shown]
[im 1/248]
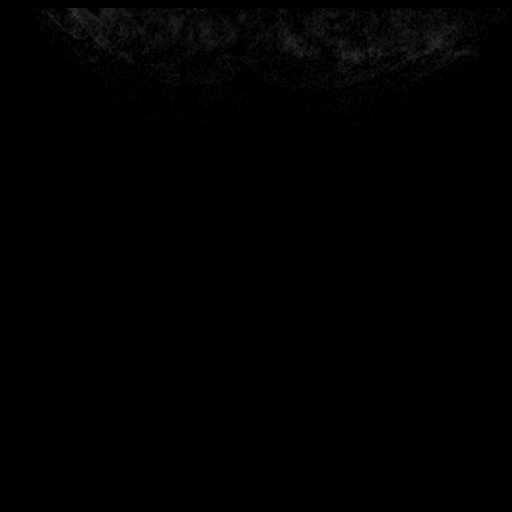
[im 50/248]
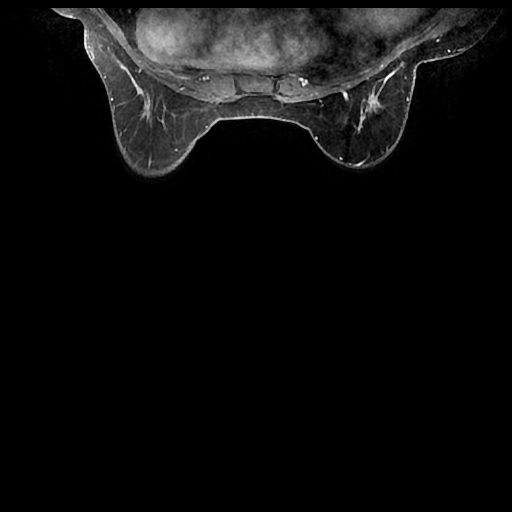

[19 of 48 positions shown; findings below may reference images not displayed]

Three-dimensional MR images were rendered by post-processing of the
original MR data on an independent workstation. The
three-dimensional MR images were interpreted, and findings are
reported in the following complete MRI report for this study. Three
dimensional images were evaluated at the independent [employer]
workstation
FINDINGS: Breast composition: c. Heterogeneous fibroglandular tissue.

Background parenchymal enhancement: Mild

Right breast: No suspicious mass or abnormal enhancement. Scattered
cysts are present.

Left breast: No suspicious mass or abnormal enhancement. Scattered
cysts are present. A 2.5 cm cyst within the INNER central LEFT
breast exhibits mild rim enhancement compatible with a
complicated/inflamed cyst.

Lymph nodes: No abnormal appearing lymph nodes.

Ancillary findings:  None.
IMPRESSION: No MR evidence of breast malignancy.

Bilateral breast cysts. A 2.5 cm cyst within the INNER central LEFT
breast is complicated/inflamed.

RECOMMENDATION:
Bilateral screening mammogram in 1 year

Consider annual screening breast MRI as indicated.

BI-RADS CATEGORY  2: Benign.
